# Patient Record
Sex: Female | Born: 1985 | Race: White | Hispanic: No | Marital: Married | Smoking: Never smoker
Health system: Southern US, Community
[De-identification: ages and names within clinical notes are randomized; demographics above are authoritative.]

## PROBLEM LIST (undated history)

## (undated) ENCOUNTER — Inpatient Hospital Stay (HOSPITAL_COMMUNITY): Payer: Self-pay

## (undated) DIAGNOSIS — Z789 Other specified health status: Secondary | ICD-10-CM

## (undated) DIAGNOSIS — N63 Unspecified lump in unspecified breast: Secondary | ICD-10-CM

## (undated) DIAGNOSIS — N200 Calculus of kidney: Secondary | ICD-10-CM

## (undated) HISTORY — PX: WISDOM TOOTH EXTRACTION: SHX21

## (undated) HISTORY — PX: NASAL SINUS SURGERY: SHX719

## (undated) HISTORY — PX: FRACTURE SURGERY: SHX138

## (undated) HISTORY — DX: Unspecified lump in unspecified breast: N63.0

## (undated) HISTORY — PX: DILATION AND CURETTAGE OF UTERUS: SHX78

---
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
## 2003-03-15 ENCOUNTER — Emergency Department (HOSPITAL_COMMUNITY): Admission: EM | Admit: 2003-03-15 | Discharge: 2003-03-15 | Payer: Self-pay | Admitting: Emergency Medicine

## 2003-10-01 ENCOUNTER — Encounter: Payer: Self-pay | Admitting: Emergency Medicine

## 2003-10-01 ENCOUNTER — Emergency Department (HOSPITAL_COMMUNITY): Admission: EM | Admit: 2003-10-01 | Discharge: 2003-10-01 | Payer: Self-pay | Admitting: Emergency Medicine

## 2004-04-08 ENCOUNTER — Emergency Department (HOSPITAL_COMMUNITY): Admission: EM | Admit: 2004-04-08 | Discharge: 2004-04-08 | Payer: Self-pay | Admitting: Emergency Medicine

## 2006-08-10 ENCOUNTER — Ambulatory Visit (HOSPITAL_COMMUNITY): Admission: RE | Admit: 2006-08-10 | Discharge: 2006-08-10 | Payer: Self-pay | Admitting: Otolaryngology

## 2007-04-07 ENCOUNTER — Emergency Department (HOSPITAL_COMMUNITY): Admission: EM | Admit: 2007-04-07 | Discharge: 2007-04-07 | Payer: Self-pay | Admitting: Emergency Medicine

## 2007-09-05 ENCOUNTER — Other Ambulatory Visit: Admission: RE | Admit: 2007-09-05 | Discharge: 2007-09-05 | Payer: Self-pay | Admitting: Obstetrics and Gynecology

## 2008-08-11 ENCOUNTER — Other Ambulatory Visit: Admission: RE | Admit: 2008-08-11 | Discharge: 2008-08-11 | Payer: Self-pay | Admitting: Obstetrics and Gynecology

## 2009-08-16 ENCOUNTER — Other Ambulatory Visit: Admission: RE | Admit: 2009-08-16 | Discharge: 2009-08-16 | Payer: Self-pay | Admitting: Obstetrics and Gynecology

## 2009-10-01 ENCOUNTER — Ambulatory Visit: Payer: Self-pay | Admitting: Vascular Surgery

## 2010-04-02 ENCOUNTER — Emergency Department (HOSPITAL_COMMUNITY): Admission: EM | Admit: 2010-04-02 | Discharge: 2010-04-02 | Payer: Self-pay | Admitting: Family Medicine

## 2010-04-27 ENCOUNTER — Ambulatory Visit (HOSPITAL_COMMUNITY): Admission: RE | Admit: 2010-04-27 | Discharge: 2010-04-27 | Payer: Self-pay | Admitting: Preventative Medicine

## 2010-08-30 ENCOUNTER — Other Ambulatory Visit: Admission: RE | Admit: 2010-08-30 | Discharge: 2010-08-30 | Payer: Self-pay | Admitting: Obstetrics and Gynecology

## 2010-09-18 ENCOUNTER — Emergency Department (HOSPITAL_COMMUNITY): Admission: EM | Admit: 2010-09-18 | Discharge: 2010-09-18 | Payer: Self-pay | Admitting: Emergency Medicine

## 2010-09-20 ENCOUNTER — Ambulatory Visit: Payer: Self-pay | Admitting: Cardiology

## 2010-09-20 ENCOUNTER — Encounter (INDEPENDENT_AMBULATORY_CARE_PROVIDER_SITE_OTHER): Payer: Self-pay | Admitting: Internal Medicine

## 2010-09-20 ENCOUNTER — Ambulatory Visit (HOSPITAL_COMMUNITY): Admission: RE | Admit: 2010-09-20 | Discharge: 2010-09-20 | Payer: Self-pay | Admitting: Internal Medicine

## 2010-12-24 ENCOUNTER — Inpatient Hospital Stay (HOSPITAL_COMMUNITY)
Admission: AD | Admit: 2010-12-24 | Discharge: 2010-12-25 | Payer: Self-pay | Source: Home / Self Care | Attending: Obstetrics and Gynecology | Admitting: Obstetrics and Gynecology

## 2011-01-02 LAB — HCG, QUANTITATIVE, PREGNANCY: hCG, Beta Chain, Quant, S: 11503 m[IU]/mL — ABNORMAL HIGH (ref <5)

## 2011-01-02 LAB — URINALYSIS, ROUTINE W REFLEX MICROSCOPIC
Bilirubin Urine: NEGATIVE
Ketones, ur: NEGATIVE mg/dL
Leukocytes, UA: NEGATIVE
Nitrite: NEGATIVE
Protein, ur: NEGATIVE mg/dL
Specific Gravity, Urine: 1.025 (ref 1.005–1.030)
Urine Glucose, Fasting: NEGATIVE mg/dL
Urobilinogen, UA: 0.2 mg/dL (ref 0.0–1.0)
pH: 6 (ref 5.0–8.0)

## 2011-01-02 LAB — URINE MICROSCOPIC-ADD ON

## 2011-01-02 LAB — CBC
HCT: 36.6 % (ref 36.0–46.0)
Hemoglobin: 12.1 g/dL (ref 12.0–15.0)
MCH: 28.9 pg (ref 26.0–34.0)
MCHC: 33.1 g/dL (ref 30.0–36.0)
MCV: 87.6 fL (ref 78.0–100.0)
Platelets: 240 10*3/uL (ref 150–400)
RBC: 4.18 MIL/uL (ref 3.87–5.11)
RDW: 12.5 % (ref 11.5–15.5)
WBC: 7.7 10*3/uL (ref 4.0–10.5)

## 2011-01-02 LAB — ABO/RH: ABO/RH(D): O POS

## 2011-01-02 LAB — WET PREP, GENITAL
Clue Cells Wet Prep HPF POC: NONE SEEN
Trich, Wet Prep: NONE SEEN
Yeast Wet Prep HPF POC: NONE SEEN

## 2011-01-02 LAB — GC/CHLAMYDIA PROBE AMP, GENITAL
Chlamydia, DNA Probe: NEGATIVE
GC Probe Amp, Genital: NEGATIVE

## 2011-01-02 LAB — POCT PREGNANCY, URINE: Preg Test, Ur: POSITIVE

## 2011-01-09 ENCOUNTER — Inpatient Hospital Stay (HOSPITAL_COMMUNITY)
Admission: AD | Admit: 2011-01-09 | Discharge: 2011-01-09 | Payer: Self-pay | Source: Home / Self Care | Attending: Obstetrics and Gynecology | Admitting: Obstetrics and Gynecology

## 2011-01-10 LAB — CBC
HCT: 31.8 % — ABNORMAL LOW (ref 36.0–46.0)
Hemoglobin: 10.7 g/dL — ABNORMAL LOW (ref 12.0–15.0)
MCH: 29.3 pg (ref 26.0–34.0)
MCHC: 33.6 g/dL (ref 30.0–36.0)
MCV: 87.1 fL (ref 78.0–100.0)
Platelets: 190 10*3/uL (ref 150–400)
RBC: 3.65 MIL/uL — ABNORMAL LOW (ref 3.87–5.11)
RDW: 12.7 % (ref 11.5–15.5)
WBC: 7 10*3/uL (ref 4.0–10.5)

## 2011-01-23 ENCOUNTER — Other Ambulatory Visit (HOSPITAL_COMMUNITY): Payer: Self-pay | Admitting: Obstetrics and Gynecology

## 2011-01-23 ENCOUNTER — Ambulatory Visit (HOSPITAL_COMMUNITY)
Admission: RE | Admit: 2011-01-23 | Discharge: 2011-01-23 | Disposition: A | Discharge status: Home / Self Care | Payer: BC Managed Care – PPO | Source: Ambulatory Visit | Attending: Obstetrics and Gynecology | Admitting: Obstetrics and Gynecology

## 2011-01-23 DIAGNOSIS — O021 Missed abortion: Secondary | ICD-10-CM | POA: Insufficient documentation

## 2011-01-23 LAB — CBC
HCT: 39.8 % (ref 36.0–46.0)
Hemoglobin: 13.3 g/dL (ref 12.0–15.0)
MCH: 29.5 pg (ref 26.0–34.0)
MCHC: 33.4 g/dL (ref 30.0–36.0)
MCV: 88.2 fL (ref 78.0–100.0)
Platelets: 212 10*3/uL (ref 150–400)
RBC: 4.51 MIL/uL (ref 3.87–5.11)
RDW: 12.8 % (ref 11.5–15.5)
WBC: 9 10*3/uL (ref 4.0–10.5)

## 2011-01-29 NOTE — Op Note (Signed)
  NAME:  Gina Weaver, Gina Weaver             ACCOUNT NO.:  [id number]  MEDICAL RECORD NO.:  [id number]           PATIENT TYPE:  O  LOCATION:  [facility]                          [facility]ACILITY:  [facility]  PHYSICIAN:  Zelphia Cairo, MD    DATE OF BIRTH:  Nov 11, 1986  DATE OF PROCEDURE:  01/23/2011 DATE OF DISCHARGE:                              OPERATIVE REPORT   PREOPERATIVE DIAGNOSES: 1. Missed abortion. 2. Vaginal bleeding in pregnancy.  PROCEDURES: 1. Cervical block. 2. Suction dilatation and evacuation.  SURGEON:  Zelphia Cairo, MD  ANESTHESIA:  MAC and local.  SPECIMEN:  Products of conception to Pathology.  BLOOD LOSS:  Minimal.  COMPLICATIONS:  None.  CONDITION:  Stable to recovery room.  PROCEDURE:  [name] was taken to the operating room after informed consent was obtained.  She was placed in the dorsal lithotomy position using Allen stirrups, prepped and draped in sterile fashion once MAC anesthesia was determined to be adequate.  A bivalve speculum was placed in the vagina and a single-tooth tenaculum placed on the anterior lip of the cervix, 1% Nesacaine was used to provide a cervical block.  The cervix was then serially dilated using Pratt dilator and an 8-French suction catheter was inserted into the uterine cavity and products of conception were removed.  A gentle curetting was then performed with an endometrial cavity to ensure uterine cry.  Once the uterine cry was felt throughout the uterine cavity, the suction catheter was reinserted to remove any clots and debris.  No further tissue was noted to the suction catheter.  Tenaculum and speculum were removed.  She was taken to the recovery room in stable condition.  Sponge, lap, needle and instrument counts were correct x2.    Zelphia Cairo, MD    GA/MEDQ  D:  01/23/2011  T:  01/24/2011  Job:  [id number]  Electronically Signed by Zelphia Cairo MD on 01/26/2011 01:28:58 PM

## 2011-02-12 ENCOUNTER — Inpatient Hospital Stay (HOSPITAL_COMMUNITY)
Admission: AD | Admit: 2011-02-12 | Discharge: 2011-02-13 | Disposition: A | Discharge status: Home / Self Care | Payer: BC Managed Care – PPO | Source: Ambulatory Visit | Attending: Obstetrics and Gynecology | Admitting: Obstetrics and Gynecology

## 2011-02-12 DIAGNOSIS — O034 Incomplete spontaneous abortion without complication: Secondary | ICD-10-CM | POA: Insufficient documentation

## 2011-02-13 ENCOUNTER — Inpatient Hospital Stay (HOSPITAL_COMMUNITY): Payer: BC Managed Care – PPO

## 2011-02-13 LAB — HCG, QUANTITATIVE, PREGNANCY: hCG, Beta Chain, Quant, S: 231 m[IU]/mL — ABNORMAL HIGH (ref <5)

## 2011-02-13 LAB — CBC
HCT: 32.7 % — ABNORMAL LOW (ref 36.0–46.0)
Hemoglobin: 10.8 g/dL — ABNORMAL LOW (ref 12.0–15.0)
MCH: 29.6 pg (ref 26.0–34.0)
MCHC: 33 g/dL (ref 30.0–36.0)
MCV: 89.6 fL (ref 78.0–100.0)
Platelets: 184 10*3/uL (ref 150–400)
RBC: 3.65 MIL/uL — ABNORMAL LOW (ref 3.87–5.11)
RDW: 13 % (ref 11.5–15.5)
WBC: 7.3 10*3/uL (ref 4.0–10.5)

## 2011-03-01 LAB — POCT I-STAT, CHEM 8
BUN: 11 mg/dL (ref 6–23)
Calcium, Ion: 1.18 mmol/L (ref 1.12–1.32)
Chloride: 106 mEq/L (ref 96–112)
Creatinine, Ser: 0.6 mg/dL (ref 0.4–1.2)
Glucose, Bld: 95 mg/dL (ref 70–99)
HCT: 47 % — ABNORMAL HIGH (ref 36.0–46.0)
Hemoglobin: 16 g/dL — ABNORMAL HIGH (ref 12.0–15.0)
Potassium: 3.7 mEq/L (ref 3.5–5.1)
Sodium: 142 mEq/L (ref 135–145)
TCO2: 26 mmol/L (ref 0–100)

## 2011-05-02 NOTE — Consult Note (Signed)
NEW PATIENT CONSULTATION   Gina Weaver, Gina Weaver  DOB:  [birth year]                                       10/01/2009  ZOXWR#:[id number]   The patient presents today for evaluation of left leg pain and swelling.  She is a very pleasant 25 year old white female who had a significant  injury with a fall as a 58-year-old.  She had multiple injuries including  a femur fracture which was treated at [facility].  She  is in traction and then a total body cast.  She had a much better  outcome than expected and eventually recovered normal walking and  actually ran cross country in high school.  She has continued to have a  heavy sensation of her left thigh with soreness with exercise.  She  specifically denies any swelling in her left leg and has no symptoms in  her right leg.  She had undergone a  venous refill study at an  apothecary store and was told that this was abnormal and may need  compression garments.  She is seeing [place] today for a further discussion.  According to her mother who is here with her today she did have a DVT at  the time of her injury when she was 4 in 35.   PAST MEDICAL HISTORY:  Otherwise completely normal for cardiac,  pulmonary or GI difficulty.   FAMILY HISTORY:  Is negative for premature atherosclerotic disease.   SOCIAL HISTORY:  She is married.  She does not smoke or drink alcohol.   REVIEW OF SYSTEMS:  Positive for asthma, pain in the leg with walking,  occasional dizziness, otherwise normal.   PHYSICAL EXAMINATION:  A well-developed, well-nourished, thin white  female in no acute stress.  Her blood pressure is 108/69, pulse 84,  respirations 18.  She is grossly intact neurologically.  Her dorsalis  pedis pulses are 2+ bilaterally.  Her lower extremities showed no  evidence of varicosities or spider vein telangiectasia and no swelling.   She underwent noninvasive vascular laboratory studies in our office  today and I  reviewed this with her and this shows no evidence of reflux  in her left saphenous vein, no evidence of DVT, no evidence of venous  occlusion and no evidence of deep or superficial reflux.  I discussed  this at length with the patient and her mother present.  I do not feel  that she would require any other workup.  I do not feel that she would  have any benefit from compression garments.  She is quite pleased with  this discussion.  I explained that this is probably going to be a  chronic discomfort in her thigh related to the trauma she suffered  nearly 20 years ago.  She will see [place] again on an as-needed basis.   Larina Earthly, M.D.  Electronically Signed   TFE/MEDQ  D:  10/01/2009  T:  10/01/2009  Job:  [id number]   cc:   Kingsley Callander. Ouida Sills, MD

## 2011-05-02 NOTE — Procedures (Signed)
DUPLEX DEEP VENOUS EXAM - LOWER EXTREMITY   INDICATION:  Check for left lower extremity venous insufficiency.   HISTORY:  Edema:  No.  Trauma/Surgery:  Broken left femur years ago.  Pain:  No.  PE:  No.  Previous DVT:  No.  Anticoagulants:  No.  Other:   DUPLEX EXAM:                CFV   SFV   PopV  PTV    GSV                R  L  R  L  R  L  R   L  R  L  Thrombosis    o  o     o     o      o     o  Spontaneous   +  +     +     +      +     +  Phasic        +  +     +     +      +     +  Augmentation  +  +     +     +      +     +  Compressible  +  +     +     +      +     +  Competent     +  +     +     +      +     +   Legend:  + - yes  o - no  p - partial  D - decreased   IMPRESSION:  No evidence of deep vein thrombosis or venous reflux noted  in the left lower extremity.    _____________________________  Larina Earthly, M.D.   CH/MEDQ  D:  10/01/2009  T:  10/02/2009  Job:  [id number]

## 2011-06-11 IMAGING — CT CT ANGIO CHEST
2 of 6 series · 6 of 36 positions shown · IV contrast (Omnipaque 300)
Comparison: None.

CLINICAL DATA: Shortness of breath with palpitations and
dizziness.  Question pulmonary embolism.

CT ANGIOGRAPHY CHEST WITH CONTRAST
TECHNIQUE: Multidetector CT imaging of the chest was performed
using the standard protocol during bolus administration of
intravenous contrast.  Multiplanar CT image reconstructions
including MIPs were obtained to evaluate the vascular anatomy.
Contrast:  100 ml Smnipaque-B33 intravenously.

[Series 4: pe 3.0 b40f · axial · 0.50mm/px · z∈[-278,-107]mm · 5 of 87 slices shown]
[im 15/87  lung]
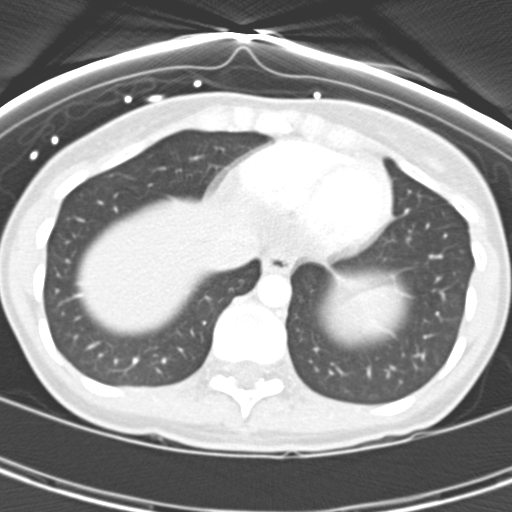
[im 29/87  mediastinal]
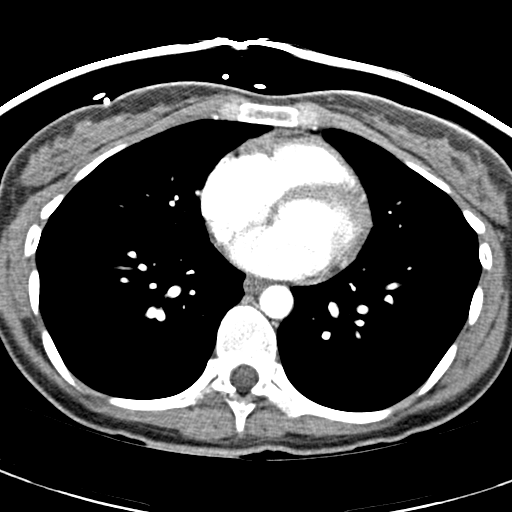
[im 44/87  lung]
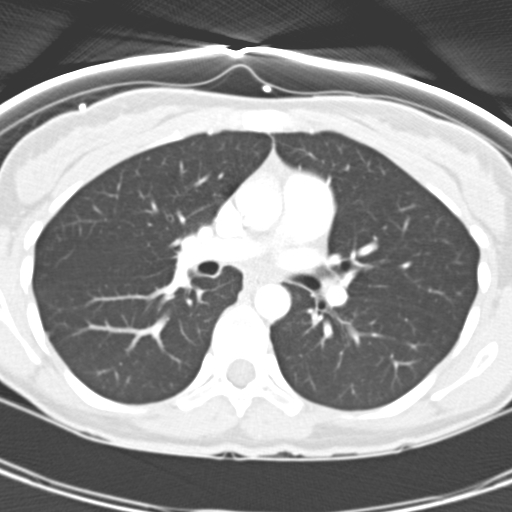
[im 58/87  mediastinal]
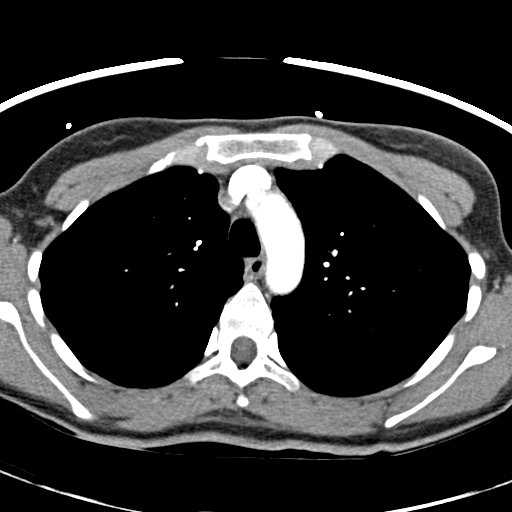
[im 72/87  lung]
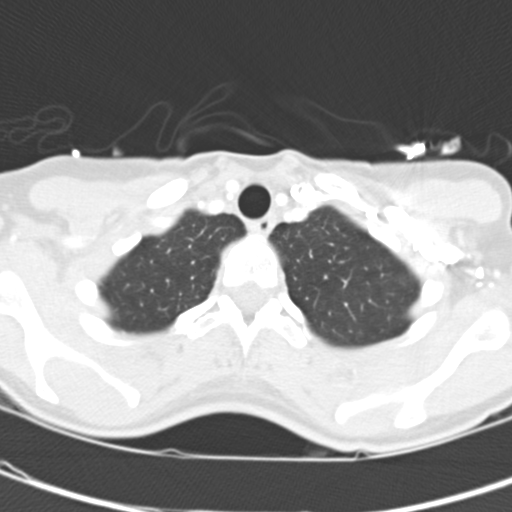

[Series 6: mpr coronal pe 3mm · coronal · 0.50mm/px · 1 of 54 slices shown]
[im 27/54  mediastinal]
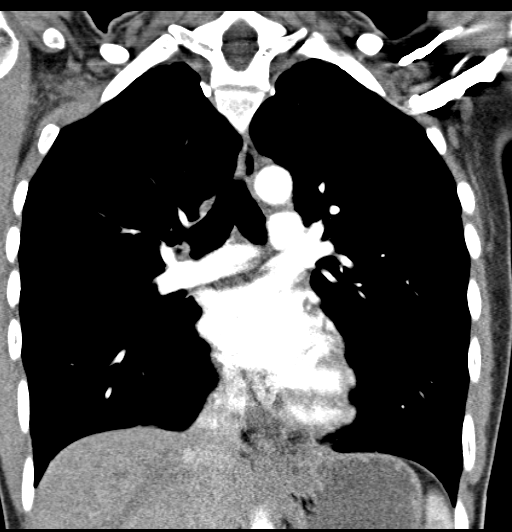

[6 of 36 positions shown; findings below may reference images not displayed]

FINDINGS: The pulmonary arteries are well opacified with contrast.
There is no evidence of acute pulmonary embolism.  The thoracic
aorta appears normal.

There are no enlarged mediastinal or hilar lymph nodes.  There is
no pleural or pericardial effusion.  The lungs are clear.  The
visualized upper abdomen appears unremarkable.

Review of the MIP images confirms the above findings.
IMPRESSION: Normal examination.  No evidence of pulmonary embolism or other
acute chest process.

## 2011-10-02 IMAGING — US US OB COMP LESS 14 WK
1 series · 14 of 25 positions shown · non-contrast
Comparison: 12/25/2010

CLINICAL DATA: Early pregnancy.  Vaginal bleeding.

OBSTETRIC <14 WK US AND TRANSVAGINAL OB US
TECHNIQUE: Both transabdominal and transvaginal ultrasound
examinations were performed for complete evaluation of the
gestation as well as the maternal uterus, adnexal regions, and
pelvic cul-de-sac.  Transvaginal technique was performed to assess
early pregnancy.

[Series 1: us ob comp less 14 wks · 25 acquisitions, 14 frames shown]
[im 1/25]
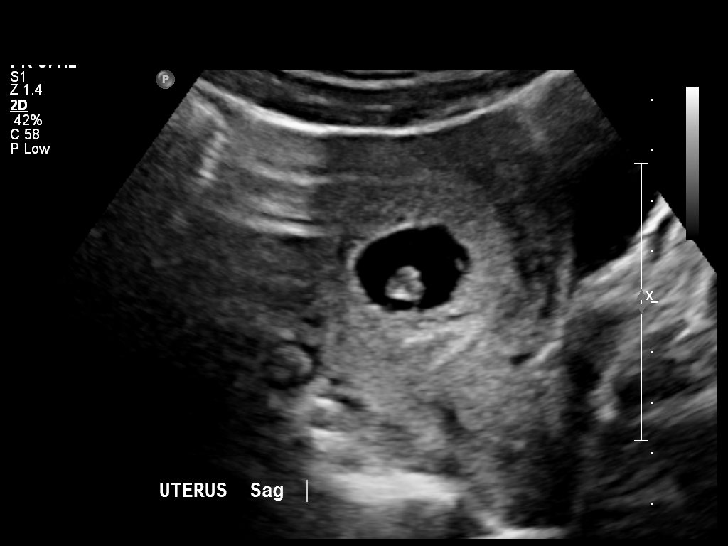
[im 3/25]
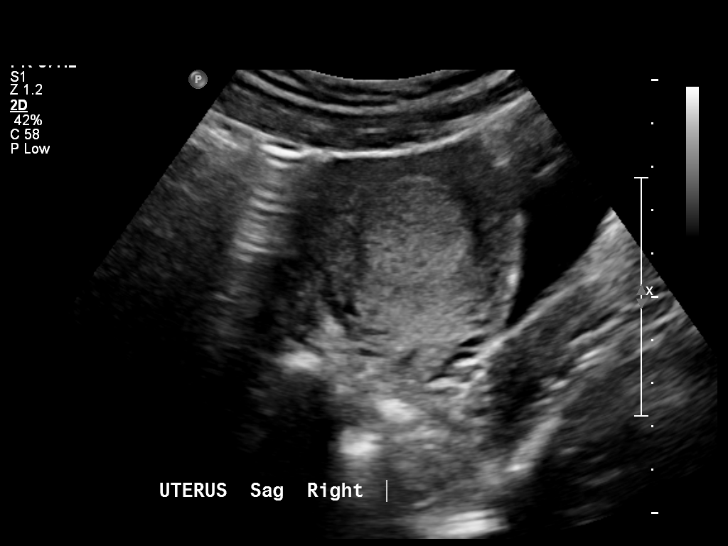
[im 5/25]
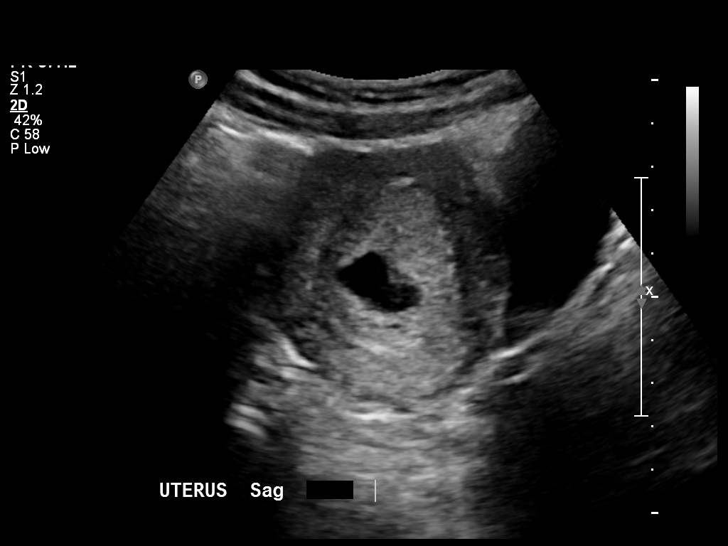
[im 7/25]
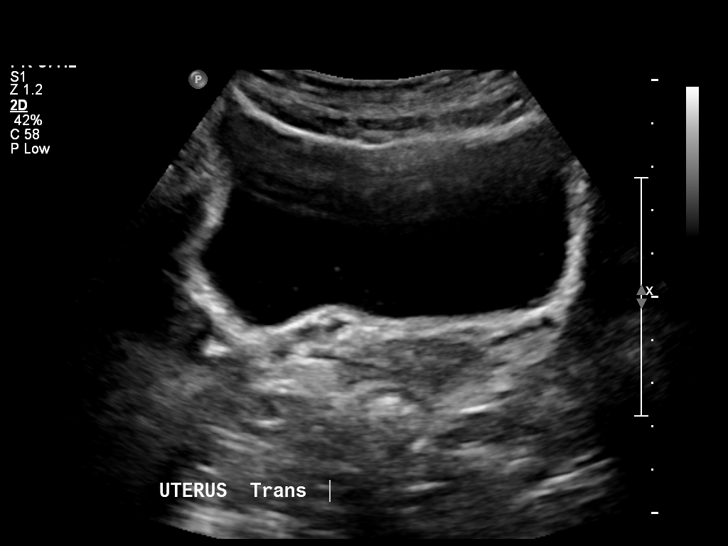
[im 9/25]
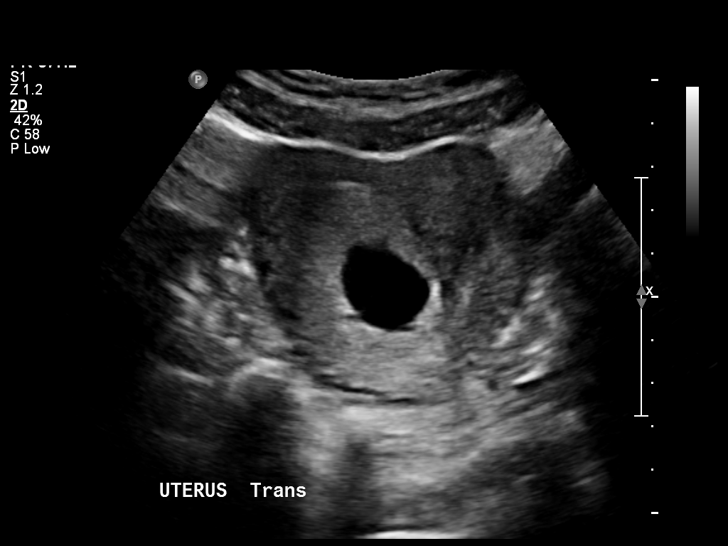
[im 10/25]
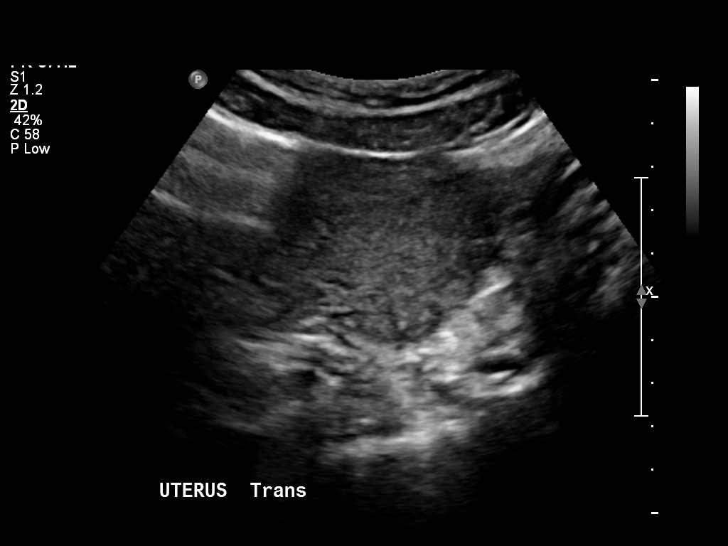
[im 12/25]
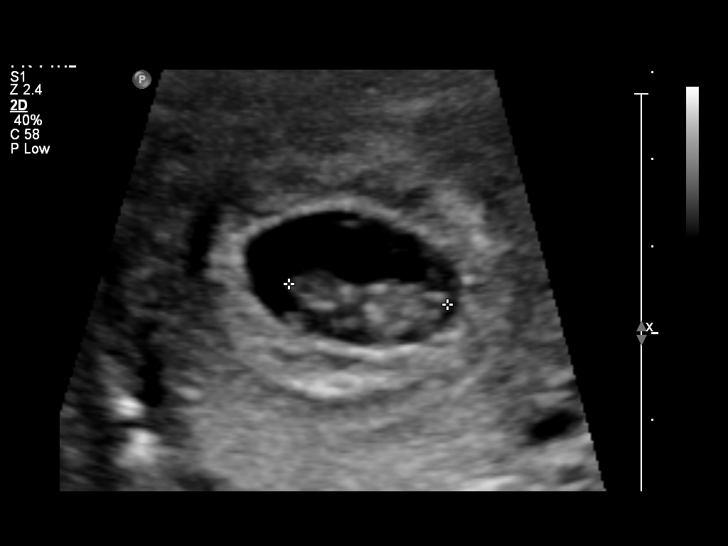
[im 14/25]
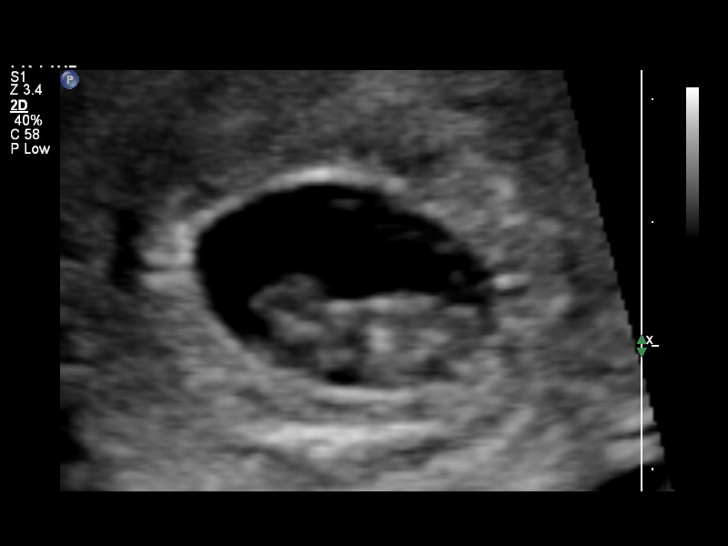
[im 16/25]
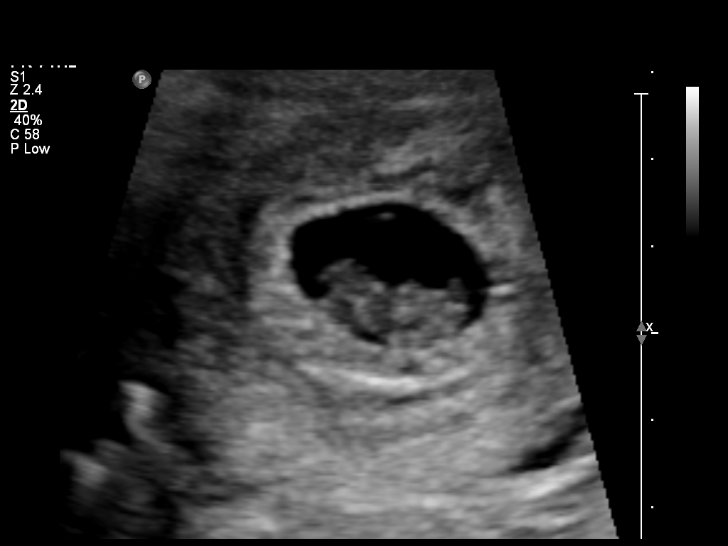
[im 17/25]
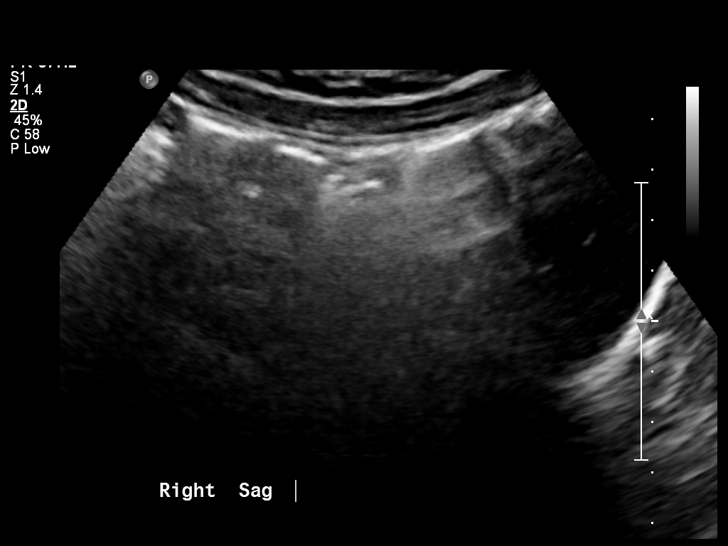
[im 19/25]
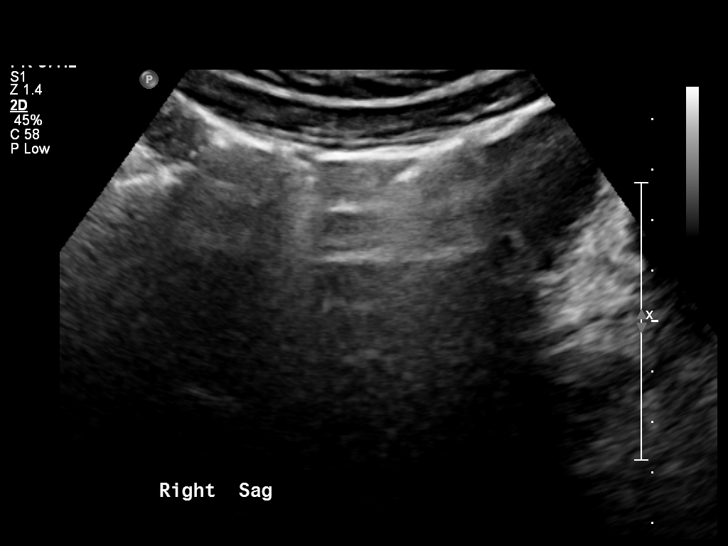
[im 21/25]
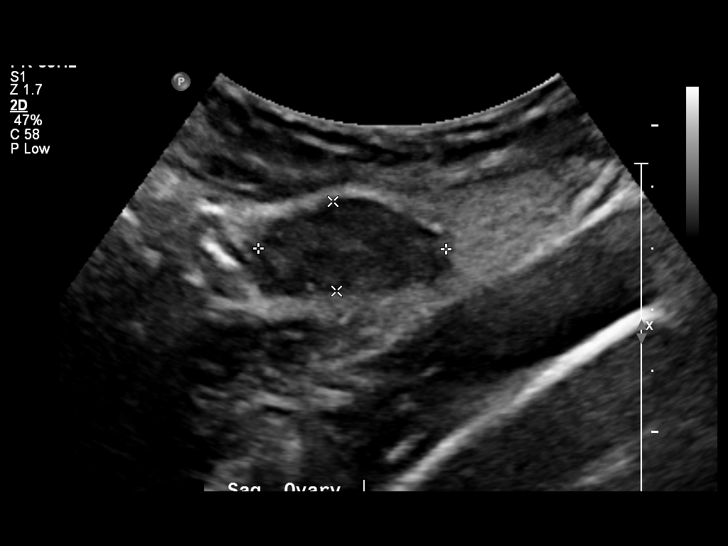
[im 23/25]
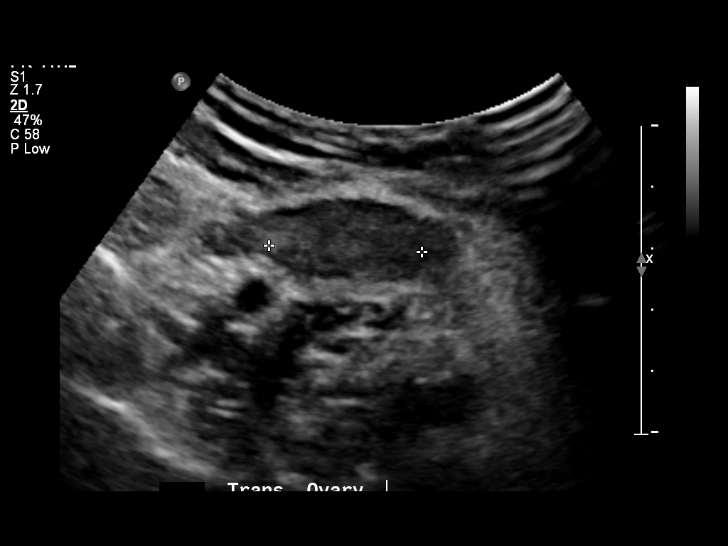
[im 25/25]
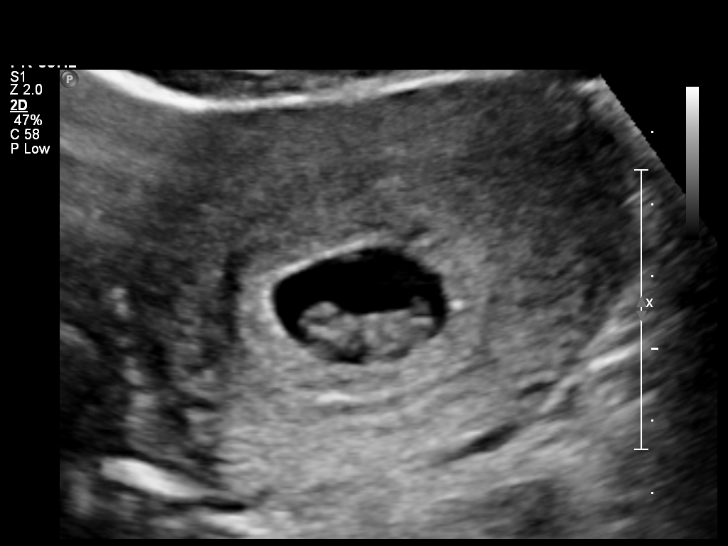

[14 of 25 positions shown; findings below may reference images not displayed]

Intrauterine gestational sac:  Single normal appearing gestational
sac.
Yolk sac: Present
Embryo: Present
Cardiac Activity: Present
Heart Rate: 172 bpm

CRL: 18   mm  8   w  3   d          US EDC: 08/18/2011

Maternal uterus/adnexae:
No subchorionic hemorrhage.  Myometrial tissue appears normal.
Right ovary not seen.  Left ovary measures 3.0 x 1.5 x 2.5 cm.  No
free fluid.

Appropriate interval growth since the previous study.
IMPRESSION: Normal appearing examination.  Single living intrauterine gestation
at 8 weeks 3 days.

## 2011-10-20 ENCOUNTER — Other Ambulatory Visit (HOSPITAL_COMMUNITY)
Admission: RE | Admit: 2011-10-20 | Discharge: 2011-10-20 | Disposition: A | Discharge status: Home / Self Care | Payer: BC Managed Care – PPO | Source: Ambulatory Visit | Attending: Obstetrics and Gynecology | Admitting: Obstetrics and Gynecology

## 2011-10-20 ENCOUNTER — Other Ambulatory Visit: Payer: Self-pay | Admitting: Adult Health

## 2011-10-20 DIAGNOSIS — Z01419 Encounter for gynecological examination (general) (routine) without abnormal findings: Secondary | ICD-10-CM | POA: Insufficient documentation

## 2011-12-14 ENCOUNTER — Encounter: Payer: Self-pay | Admitting: Emergency Medicine

## 2011-12-14 ENCOUNTER — Emergency Department (HOSPITAL_COMMUNITY): Payer: No Typology Code available for payment source

## 2011-12-14 ENCOUNTER — Other Ambulatory Visit: Payer: Self-pay

## 2011-12-14 ENCOUNTER — Emergency Department (HOSPITAL_COMMUNITY)
Admission: EM | Admit: 2011-12-14 | Discharge: 2011-12-14 | Disposition: A | Discharge status: Home / Self Care | Payer: No Typology Code available for payment source | Attending: Emergency Medicine | Admitting: Emergency Medicine

## 2011-12-14 DIAGNOSIS — R0602 Shortness of breath: Secondary | ICD-10-CM | POA: Insufficient documentation

## 2011-12-14 DIAGNOSIS — E876 Hypokalemia: Secondary | ICD-10-CM | POA: Insufficient documentation

## 2011-12-14 DIAGNOSIS — J45909 Unspecified asthma, uncomplicated: Secondary | ICD-10-CM | POA: Insufficient documentation

## 2011-12-14 DIAGNOSIS — R209 Unspecified disturbances of skin sensation: Secondary | ICD-10-CM | POA: Insufficient documentation

## 2011-12-14 DIAGNOSIS — R42 Dizziness and giddiness: Secondary | ICD-10-CM | POA: Insufficient documentation

## 2011-12-14 LAB — CBC
HCT: 37.5 % (ref 36.0–46.0)
Hemoglobin: 12.4 g/dL (ref 12.0–15.0)
MCH: 29.4 pg (ref 26.0–34.0)
MCV: 88.9 fL (ref 78.0–100.0)
Platelets: 224 10*3/uL (ref 150–400)
RBC: 4.22 MIL/uL (ref 3.87–5.11)
WBC: 6.6 10*3/uL (ref 4.0–10.5)

## 2011-12-14 LAB — BASIC METABOLIC PANEL
BUN: 12 mg/dL (ref 6–23)
CO2: 25 mEq/L (ref 19–32)
Calcium: 9.9 mg/dL (ref 8.4–10.5)
Chloride: 104 mEq/L (ref 96–112)
Creatinine, Ser: 0.63 mg/dL (ref 0.50–1.10)
Glucose, Bld: 120 mg/dL — ABNORMAL HIGH (ref 70–99)

## 2011-12-14 LAB — PREGNANCY, URINE: Preg Test, Ur: NEGATIVE

## 2011-12-14 MED ORDER — SODIUM CHLORIDE 0.9 % IV BOLUS (SEPSIS)
1000.0000 mL | Freq: Once | INTRAVENOUS | Status: AC
  Administered 2011-12-14: 1000 mL via INTRAVENOUS

## 2011-12-14 MED ORDER — DIAZEPAM 5 MG PO TABS
5.0000 mg | ORAL_TABLET | Freq: Once | ORAL | Status: AC
  Administered 2011-12-14: 5 mg via ORAL
  Filled 2011-12-14: qty 1

## 2011-12-14 MED ORDER — POTASSIUM CHLORIDE 20 MEQ/15ML (10%) PO LIQD
40.0000 meq | Freq: Once | ORAL | Status: AC
  Administered 2011-12-14: 40 meq via ORAL
  Filled 2011-12-14: qty 30

## 2011-12-14 MED ORDER — MECLIZINE HCL 25 MG PO TABS: 25.0000 mg | ORAL_TABLET | Freq: Three times a day (TID) | ORAL | Status: AC | PRN

## 2011-12-14 NOTE — ED Notes (Signed)
Pt called ambulance for shortness of breath, took two puffs from inhaler with some relief. On ems arrival patient complained of breathing difficulty when standing up/walking.

## 2011-12-14 NOTE — ED Provider Notes (Signed)
History     CSN: [id number]  Arrival date & time 12/14/11  0035   First MD Initiated Contact with Patient 12/14/11 0044      Chief Complaint  Patient presents with  . Shortness of Breath     The history is provided by the patient.   patient reports 2 weeks of dizziness which is best described as occasional lightheadedness with associated disequilibrium.  She denies classic vertiginous symptoms of room spinning round and round.  She denies unilateral arm or leg weakness.  She reports that this evening she developed the dizziness again which then made her feel short of breath and pressure in her chest and gave her a sense of numbness and tingling in her bilateral arms.  She has a history of asthma but reports her breathing was different than that.  She reports her breathing is returned to baseline and she no longer has numbness or tingling.  She still reports "a funny feeling around my head".  She reports "I just don't eel right".  She denies headache or vision changes.  She reports no recent trauma or falls.  She's currently on her menstrual cycle.  She denies abdominal pain.  She has no chest pain at this time.  She's never been evaluated for the symptoms.  She does have a prior history of nasal surgery due to severe sinus disease.  She denies recent upper respiratory symptoms.  Nothing seems to exacerbate her symptoms.  Nothing improves her symptoms.  Her symptoms are intermittent and in transient and using resolve on the round.  Tonight the numbness and tingling and difficulty swallowing or breathing was new which has since resolved.  She presented via EMS was given a breathing treatment in route her reports this did improve her breathing somewhat  Past Medical History  Diagnosis Date  . Asthma     Past Surgical History  Procedure Date  . Nasal sinus surgery     Family History  Problem Relation Age of Onset  . Diabetes Mother     History  Substance Use Topics  . Smoking status:  Never Smoker   . Smokeless tobacco: Not on file  . Alcohol Use: No    OB History    Grav Para Term Preterm Abortions TAB SAB Ect Mult Living   1    1           Review of Systems  All other systems reviewed and are negative.    Allergies  Penicillins  Home Medications   Current Outpatient Rx  Name Route Sig Dispense Refill  . ALBUTEROL SULFATE (5 MG/ML) 0.5% IN NEBU Nebulization Take 2.5 mg by nebulization every 6 (six) hours as needed.      . MULTIVITAMINS PO CAPS Oral Take 1 capsule by mouth daily.      Marland Kitchen MECLIZINE HCL 25 MG PO TABS Oral Take 1 tablet (25 mg total) by mouth 3 (three) times daily as needed. 15 tablet 0    BP 105/65  Pulse 76  Temp(Src) 97.6 F (36.4 C) (Oral)  Resp 18  Ht 5\' 4"  (1.626 m)  Wt 109 lb (49.442 kg)  BMI 18.71 kg/m2  SpO2 98%  Physical Exam  Nursing note and vitals reviewed. Constitutional: She is oriented to person, place, and time. She appears well-developed and well-nourished.  HENT:  Head: Normocephalic and atraumatic.  Eyes: Pupils are equal, round, and reactive to light.  Neck: Normal range of motion.  Cardiovascular: Normal rate, regular rhythm and normal  heart sounds.   No murmur heard. Pulmonary/Chest: Effort normal. No respiratory distress. She has no wheezes. She has no rales.  Abdominal: Soft.  Musculoskeletal: Normal range of motion.  Neurological: She is alert and oriented to person, place, and time.       5/5 strength in major muscle groups of  bilateral upper and lower extremities. Speech normal. No facial asymetry.   Skin: Skin is warm and dry.  Psychiatric: Judgment normal.       Anxious appearing    ED Course  Procedures (including critical care time)   Date: 12/14/2011  Rate: 73  Rhythm: normal sinus rhythm  QRS Axis: normal  Intervals: normal  ST/T Wave abnormalities: normal  Conduction Disutrbances:none  Narrative Interpretation:   Old EKG Reviewed: No significant changes noted     Labs Reviewed   BASIC METABOLIC PANEL - Abnormal; Notable for the following:    Potassium 3.0 (*)    Glucose, Bld 120 (*)    All other components within normal limits  CBC  PREGNANCY, URINE   Dg Chest 2 View  12/14/2011  *RADIOLOGY REPORT*  Clinical Data: Shortness of breath and palpitations; left hand and finger numbness.  History of asthma.  CHEST - 2 VIEW  Comparison: CTA of the chest performed 09/18/2010  Findings: The lungs are well-aerated and clear.  There is no evidence of focal opacification, pleural effusion or pneumothorax.  The heart is normal in size; the mediastinal contour is within normal limits.  No acute osseous abnormalities are seen.  IMPRESSION: No acute cardiopulmonary process seen.  Original Report Authenticated By: Tonia Ghent, M.D.     1. Vertigo   2. Hypokalemia    MDM  The patient feels much better after Valium.  I suspect there is a vertiginous component to this.  We discharged home with meclizine and followup with a neurologist.  She has a normal neurologic exam at this time.  Her pulmonary and cardiovascular exams are normal.  I doubt this is an asthma exacerbation.  She had mild hypokalemia that was treated with oral potassium in the emergency department.  EKG was normal sinus rhythm.         Lyanne Co, MD 12/14/11 [phone]

## 2012-04-03 ENCOUNTER — Other Ambulatory Visit: Payer: Self-pay | Admitting: Otolaryngology

## 2012-04-03 ENCOUNTER — Ambulatory Visit
Admission: RE | Admit: 2012-04-03 | Discharge: 2012-04-03 | Disposition: A | Discharge status: Home / Self Care | Payer: No Typology Code available for payment source | Source: Ambulatory Visit | Attending: Otolaryngology | Admitting: Otolaryngology

## 2012-08-29 LAB — OB RESULTS CONSOLE RPR: RPR: NONREACTIVE

## 2012-08-29 LAB — OB RESULTS CONSOLE HIV ANTIBODY (ROUTINE TESTING): HIV: NONREACTIVE

## 2012-08-29 LAB — OB RESULTS CONSOLE RUBELLA ANTIBODY, IGM: Rubella: IMMUNE

## 2012-08-29 LAB — OB RESULTS CONSOLE HEPATITIS B SURFACE ANTIGEN: Hepatitis B Surface Ag: NEGATIVE

## 2012-08-29 LAB — OB RESULTS CONSOLE ANTIBODY SCREEN: Antibody Screen: NEGATIVE

## 2012-10-31 ENCOUNTER — Inpatient Hospital Stay (HOSPITAL_COMMUNITY)
Admission: AD | Admit: 2012-10-31 | Discharge: 2012-10-31 | Disposition: A | Discharge status: Home / Self Care | Payer: No Typology Code available for payment source | Source: Ambulatory Visit | Attending: Obstetrics and Gynecology | Admitting: Obstetrics and Gynecology

## 2012-10-31 ENCOUNTER — Encounter (HOSPITAL_COMMUNITY): Payer: Self-pay | Admitting: *Deleted

## 2012-10-31 ENCOUNTER — Other Ambulatory Visit: Payer: Self-pay

## 2012-10-31 DIAGNOSIS — R002 Palpitations: Secondary | ICD-10-CM | POA: Insufficient documentation

## 2012-10-31 DIAGNOSIS — O99891 Other specified diseases and conditions complicating pregnancy: Secondary | ICD-10-CM | POA: Insufficient documentation

## 2012-10-31 HISTORY — DX: Other specified health status: Z78.9

## 2012-10-31 LAB — URINE MICROSCOPIC-ADD ON

## 2012-10-31 LAB — URINALYSIS, ROUTINE W REFLEX MICROSCOPIC
Bilirubin Urine: NEGATIVE
Glucose, UA: NEGATIVE mg/dL
Nitrite: NEGATIVE
Specific Gravity, Urine: 1.025 (ref 1.005–1.030)
pH: 6.5 (ref 5.0–8.0)

## 2012-10-31 NOTE — MAU Note (Signed)
About ago, heart was racing, became real dizzy- things started going dark/vision was blurring. Passed out.  When came to, heart was still racing. Called office was told to come in. Denies cardiac hx.

## 2012-10-31 NOTE — Progress Notes (Signed)
Pt passed out in car while waiting for food

## 2012-10-31 NOTE — MAU Provider Note (Signed)
  History     CSN: [id number]  Arrival date and time: 10/31/12 1456   First Provider Initiated Contact with Patient 10/31/12 1550      Chief Complaint  Patient presents with  . Loss of Consciousness   HPI Gina Weaver is 26 y.o. G2P0010 [redacted]w[redacted]d weeks presenting with heart palpitations and silvery floaters in her vision.  Today when she awakened, she had trouble focusing.  Went to work, at lunch began with racing heart and then felt her "go out" and hit the steering wheel- was parked.  Doesn't feels she loss consciousness.  Eating regularly. Called office and instructed her to come here.  Denies injury to abdomen.  Hit forehead on the steering wheel.  Has felt heart racing while in MAU.  Denies cardiac history.  Uncomplicated pregnancy.      Past Medical History  Diagnosis Date  . Asthma   . No pertinent past medical history     Past Surgical History  Procedure Date  . Nasal sinus surgery     Family History  Problem Relation Age of Onset  . Diabetes Mother     History  Substance Use Topics  . Smoking status: Never Smoker   . Smokeless tobacco: Not on file  . Alcohol Use: No    Allergies:  Allergies  Allergen Reactions  . Penicillins Rash    Prescriptions prior to admission  Medication Sig Dispense Refill  . albuterol (PROVENTIL HFA;VENTOLIN HFA) 108 (90 BASE) MCG/ACT inhaler Inhale 2 puffs into the lungs every 6 (six) hours as needed. For shortness of breath and wheezing      . Prenatal Vit-Fe Fumarate-FA (PRENATAL MULTIVITAMIN) TABS Take 1 tablet by mouth at bedtime.        Review of Systems  Constitutional: Negative.   HENT: Negative.   Eyes: Positive for blurred vision.  Respiratory: Negative.   Cardiovascular: Positive for palpitations. Negative for chest pain.  Gastrointestinal: Negative.   Genitourinary: Negative.   Musculoskeletal: Negative.    Physical Exam   Blood pressure 116/66, pulse 81, temperature 98.2 F (36.8 C), temperature source  Oral, resp. rate 18, height 5\' 4"  (1.626 m), weight 108 lb (48.988 kg), last menstrual period 03/08/2012, SpO2 100.00%.  Physical Exam  Constitutional: She is oriented to person, place, and time. She appears well-developed and well-nourished. No distress.  HENT:  Head: Normocephalic.  Neck: Normal range of motion.  Cardiovascular: Normal rate and regular rhythm.   Respiratory: Effort normal.  Neurological: She is alert and oriented to person, place, and time.  Skin: Skin is warm and dry.  Psychiatric: She has a normal mood and affect. Her behavior is normal.    EKG:  Normal sinus rhythm with rate of 81 MAU Course  Procedures  MDM 16:12 Reported to [facility] to Dr. Rana Snare.  Order given for EKG.  If normal, may discharge home.  Instruct patient to call for recurrent palpitations for them to refer to cardiologist.  Stress hydration.  Assessment and Plan  A:  Near syncopal episode at [redacted]w[redacted]d     Heart palpitations    Normal EKG  P:  Patient instructed to call office if palpitations recur.  Per Dr. Rana Snare, they will refer to Cardiologist      Stressed importance of staying well hydrated       KEY,EVE M 10/31/2012, 3:53 PM

## 2012-10-31 NOTE — MAU Note (Signed)
Pt passed out while waiting for lunch pt was sitting down

## 2012-10-31 NOTE — Progress Notes (Signed)
Pt states she can't seem to focus

## 2012-11-02 LAB — URINE CULTURE

## 2013-03-07 ENCOUNTER — Encounter (HOSPITAL_COMMUNITY): Payer: Self-pay | Admitting: *Deleted

## 2013-03-07 ENCOUNTER — Inpatient Hospital Stay (HOSPITAL_COMMUNITY)
Admission: AD | Admit: 2013-03-07 | Discharge: 2013-03-07 | Disposition: A | Discharge status: Home / Self Care | Payer: BC Managed Care – PPO | Source: Ambulatory Visit | Attending: Obstetrics and Gynecology | Admitting: Obstetrics and Gynecology

## 2013-03-07 DIAGNOSIS — R51 Headache: Secondary | ICD-10-CM | POA: Insufficient documentation

## 2013-03-07 DIAGNOSIS — IMO0002 Reserved for concepts with insufficient information to code with codable children: Secondary | ICD-10-CM | POA: Insufficient documentation

## 2013-03-07 LAB — COMPREHENSIVE METABOLIC PANEL
ALT: 12 U/L (ref 0–35)
Alkaline Phosphatase: 174 U/L — ABNORMAL HIGH (ref 39–117)
CO2: 24 mEq/L (ref 19–32)
GFR calc Af Amer: >90 mL/min (ref >90)
GFR calc non Af Amer: >90 mL/min (ref >90)
Glucose, Bld: 79 mg/dL (ref 70–99)
Potassium: 3.1 mEq/L — ABNORMAL LOW (ref 3.5–5.1)
Sodium: 137 mEq/L (ref 135–145)

## 2013-03-07 LAB — URINALYSIS, ROUTINE W REFLEX MICROSCOPIC
Bilirubin Urine: NEGATIVE
Glucose, UA: NEGATIVE mg/dL
Hgb urine dipstick: NEGATIVE
Ketones, ur: NEGATIVE mg/dL
Protein, ur: NEGATIVE mg/dL

## 2013-03-07 LAB — URINE MICROSCOPIC-ADD ON

## 2013-03-07 LAB — CBC
HCT: 35.1 % — ABNORMAL LOW (ref 36.0–46.0)
Hemoglobin: 11.6 g/dL — ABNORMAL LOW (ref 12.0–15.0)
MCH: 30.2 pg (ref 26.0–34.0)
RBC: 3.84 MIL/uL — ABNORMAL LOW (ref 3.87–5.11)

## 2013-03-07 NOTE — MAU Note (Signed)
Pt. Is 35.[redacted] weeks pregnant and is Here due to an increase in swelling in her feet and legs. Also complaining of floaters on and off. Also, left sided headache.

## 2013-03-07 NOTE — Treatment Plan (Signed)
Dr. Henderson Cloud notified of pt. Labs and bp, fetal heart tracing and contraction pattern. Orders for discharge home.

## 2013-03-07 NOTE — MAU Note (Signed)
Patient states she has had increasing swelling for the past 2 weeks. Has been seeing floaters. Patient denies pain, bleeding or leaking and reports good fetal movement.

## 2013-03-07 NOTE — Progress Notes (Signed)
27 yo G3P0 at 35 3/7 weeks C/O bilat swelling in feet and some "floaters" in vision. No blurry vision or severe HA or epigastric pain.  No vaginal bleeding or ROM.  Blood pressure 123/77, pulse 79, temperature 98.4 F (36.9 C), temperature source Oral, resp. rate 18, height 5' 4.5" (1.638 m), weight 137 lb 12.8 oz (62.506 kg), last menstrual period 03/08/2012, SpO2 100.00%.  Results for orders placed during the hospital encounter of 03/07/13 (from the past 24 hour(s))  CBC     Status: Abnormal   Collection Time    03/07/13  7:06 PM      Result Value Range   WBC 10.4  4.0 - 10.5 K/uL   RBC 3.84 (*) 3.87 - 5.11 MIL/uL   Hemoglobin 11.6 (*) 12.0 - 15.0 g/dL   HCT 16.1 (*) 09.6 - 04.5 %   MCV 91.4  78.0 - 100.0 fL   MCH 30.2  26.0 - 34.0 pg   MCHC 33.0  30.0 - 36.0 g/dL   RDW 40.9  81.1 - 91.4 %   Platelets 209  150 - 400 K/uL   CMET pending  Abd gravid without epigastric tenderness Uterus soft and NT LE bilat +2 edema DTR 2+ without clonus FHT reactive UC mild and irregular  A: dependent edema of pregnancy  P: Check CMET     If normal for pregnancy, will D/C home     [facility]     D/W patient and husband symptoms of preeclampsia     FU office as scheduled

## 2013-03-09 LAB — URINE CULTURE

## 2013-03-13 ENCOUNTER — Encounter (HOSPITAL_COMMUNITY): Payer: Self-pay | Admitting: *Deleted

## 2013-03-13 ENCOUNTER — Inpatient Hospital Stay (HOSPITAL_COMMUNITY)
Admission: AD | Admit: 2013-03-13 | Discharge: 2013-03-17 | DRG: 371 | Disposition: A | Discharge status: Home / Self Care | Payer: BC Managed Care – PPO | Source: Ambulatory Visit | Attending: Obstetrics and Gynecology | Admitting: Obstetrics and Gynecology

## 2013-03-13 DIAGNOSIS — O321XX0 Maternal care for breech presentation, not applicable or unspecified: Principal | ICD-10-CM | POA: Diagnosis present

## 2013-03-13 DIAGNOSIS — O4100X0 Oligohydramnios, unspecified trimester, not applicable or unspecified: Secondary | ICD-10-CM | POA: Diagnosis present

## 2013-03-13 HISTORY — DX: Calculus of kidney: N20.0

## 2013-03-13 MED ORDER — PRENATAL MULTIVITAMIN CH: 1.0000 | ORAL_TABLET | Freq: Every day | ORAL | Status: DC

## 2013-03-13 MED ORDER — ACETAMINOPHEN 325 MG PO TABS: 650.0000 mg | ORAL_TABLET | ORAL | Status: DC | PRN

## 2013-03-13 MED ORDER — ZOLPIDEM TARTRATE 5 MG PO TABS: 5.0000 mg | ORAL_TABLET | Freq: Every evening | ORAL | Status: DC | PRN

## 2013-03-13 MED ORDER — DEXTROSE IN LACTATED RINGERS 5 % IV SOLN
INTRAVENOUS | Status: DC
  Administered 2013-03-13: 125 mL/h via INTRAVENOUS

## 2013-03-13 MED ORDER — DOCUSATE SODIUM 100 MG PO CAPS: 100.0000 mg | ORAL_CAPSULE | Freq: Every day | ORAL | Status: DC

## 2013-03-13 MED ORDER — CALCIUM CARBONATE ANTACID 500 MG PO CHEW: 2.0000 | CHEWABLE_TABLET | ORAL | Status: DC | PRN

## 2013-03-13 NOTE — H&P (Signed)
27 yo G3P0 @ 36+2 wks presents w/ oligohydramnios.  Pt denies LOF, VB and ctx.  Pregnancy o/w uncomplicated.  PMHx:  See hollister  Af, VSS + FHT Gen - NAD Abd - gravid, NT Ext 2+ edema bilaterally PV - cvx closed, neg pool, negative nitrozine  [place]:  Breech, normal growth.  AFV 4cm (<3%),  Dopplers wnl  A/P:  Oligohydramnios, breech w/out evidence of rupture Admit for obs IVF and rpt [place] in am

## 2013-03-14 ENCOUNTER — Inpatient Hospital Stay (HOSPITAL_COMMUNITY): Payer: BC Managed Care – PPO

## 2013-03-14 ENCOUNTER — Encounter (HOSPITAL_COMMUNITY): Payer: Self-pay | Admitting: Anesthesiology

## 2013-03-14 ENCOUNTER — Encounter (HOSPITAL_COMMUNITY)
Admission: AD | Disposition: A | Discharge status: Home / Self Care | Payer: Self-pay | Source: Ambulatory Visit | Attending: Obstetrics and Gynecology

## 2013-03-14 ENCOUNTER — Inpatient Hospital Stay (HOSPITAL_COMMUNITY): Payer: BC Managed Care – PPO | Admitting: Anesthesiology

## 2013-03-14 LAB — CBC
MCHC: 33.9 g/dL (ref 30.0–36.0)
Platelets: 174 10*3/uL (ref 150–400)
RDW: 13.8 % (ref 11.5–15.5)
WBC: 9 10*3/uL (ref 4.0–10.5)

## 2013-03-14 SURGERY — Surgical Case
Anesthesia: Spinal | Site: Abdomen | Wound class: Clean Contaminated

## 2013-03-14 MED ORDER — MORPHINE SULFATE (PF) 0.5 MG/ML IJ SOLN
INTRAMUSCULAR | Status: DC | PRN
  Administered 2013-03-14: .1 mg via INTRATHECAL

## 2013-03-14 MED ORDER — IBUPROFEN 800 MG PO TABS
800.0000 mg | ORAL_TABLET | Freq: Three times a day (TID) | ORAL | Status: DC | PRN
  Administered 2013-03-15 – 2013-03-17 (×7): 800 mg via ORAL
  Filled 2013-03-14 (×7): qty 1

## 2013-03-14 MED ORDER — MENTHOL 3 MG MT LOZG: 1.0000 | LOZENGE | OROMUCOSAL | Status: DC | PRN

## 2013-03-14 MED ORDER — OXYCODONE-ACETAMINOPHEN 5-325 MG PO TABS
1.0000 | ORAL_TABLET | Freq: Four times a day (QID) | ORAL | Status: DC | PRN
  Administered 2013-03-15 – 2013-03-17 (×5): 1 via ORAL
  Filled 2013-03-14 (×5): qty 1

## 2013-03-14 MED ORDER — BISACODYL 10 MG RE SUPP: 10.0000 mg | Freq: Every day | RECTAL | Status: DC | PRN

## 2013-03-14 MED ORDER — OXYTOCIN 10 UNIT/ML IJ SOLN
40.0000 [IU] | INTRAVENOUS | Status: DC | PRN
  Administered 2013-03-14: 40 [IU] via INTRAVENOUS

## 2013-03-14 MED ORDER — MORPHINE SULFATE 0.5 MG/ML IJ SOLN
INTRAMUSCULAR | Status: AC
  Filled 2013-03-14: qty 10

## 2013-03-14 MED ORDER — ZOLPIDEM TARTRATE 5 MG PO TABS: 5.0000 mg | ORAL_TABLET | Freq: Every evening | ORAL | Status: DC | PRN

## 2013-03-14 MED ORDER — DIPHENHYDRAMINE HCL 50 MG/ML IJ SOLN: 25.0000 mg | INTRAMUSCULAR | Status: DC | PRN

## 2013-03-14 MED ORDER — SODIUM CHLORIDE 0.9 % IV SOLN
250.0000 mL | INTRAVENOUS | Status: DC
  Administered 2013-03-14: 250 mL via INTRAVENOUS

## 2013-03-14 MED ORDER — SCOPOLAMINE 1 MG/3DAYS TD PT72
MEDICATED_PATCH | TRANSDERMAL | Status: AC
  Administered 2013-03-14: 1.5 mg
  Filled 2013-03-14: qty 1

## 2013-03-14 MED ORDER — DIPHENHYDRAMINE HCL 25 MG PO CAPS: 25.0000 mg | ORAL_CAPSULE | Freq: Four times a day (QID) | ORAL | Status: DC | PRN

## 2013-03-14 MED ORDER — OXYTOCIN 10 UNIT/ML IJ SOLN
INTRAMUSCULAR | Status: AC
  Filled 2013-03-14: qty 4

## 2013-03-14 MED ORDER — KETOROLAC TROMETHAMINE 30 MG/ML IJ SOLN: 30.0000 mg | Freq: Four times a day (QID) | INTRAMUSCULAR | Status: DC | PRN

## 2013-03-14 MED ORDER — FENTANYL CITRATE 0.05 MG/ML IJ SOLN
INTRAMUSCULAR | Status: AC
  Filled 2013-03-14: qty 2

## 2013-03-14 MED ORDER — FENTANYL CITRATE 0.05 MG/ML IJ SOLN
INTRAMUSCULAR | Status: DC | PRN
  Administered 2013-03-14: 15 ug via INTRATHECAL

## 2013-03-14 MED ORDER — NALBUPHINE SYRINGE 5 MG/0.5 ML
5.0000 mg | INJECTION | INTRAMUSCULAR | Status: DC | PRN
  Filled 2013-03-14: qty 1

## 2013-03-14 MED ORDER — OXYTOCIN 40 UNITS IN LACTATED RINGERS INFUSION - SIMPLE MED: 62.5000 mL/h | INTRAVENOUS | Status: AC

## 2013-03-14 MED ORDER — CEFAZOLIN SODIUM-DEXTROSE 2-3 GM-% IV SOLR
2.0000 g | INTRAVENOUS | Status: AC
  Administered 2013-03-14: 2 g via INTRAVENOUS
  Filled 2013-03-14: qty 50

## 2013-03-14 MED ORDER — MEPERIDINE HCL 25 MG/ML IJ SOLN
INTRAMUSCULAR | Status: AC
  Filled 2013-03-14: qty 1

## 2013-03-14 MED ORDER — DIBUCAINE 1 % RE OINT: 1.0000 "application " | TOPICAL_OINTMENT | RECTAL | Status: DC | PRN

## 2013-03-14 MED ORDER — ONDANSETRON HCL 4 MG/2ML IJ SOLN
INTRAMUSCULAR | Status: DC | PRN
  Administered 2013-03-14: 4 mg via INTRAVENOUS

## 2013-03-14 MED ORDER — SIMETHICONE 80 MG PO CHEW
80.0000 mg | CHEWABLE_TABLET | Freq: Three times a day (TID) | ORAL | Status: DC
  Administered 2013-03-15 – 2013-03-17 (×8): 80 mg via ORAL

## 2013-03-14 MED ORDER — IBUPROFEN 600 MG PO TABS: 600.0000 mg | ORAL_TABLET | Freq: Four times a day (QID) | ORAL | Status: DC | PRN

## 2013-03-14 MED ORDER — FENTANYL CITRATE 0.05 MG/ML IJ SOLN
25.0000 ug | INTRAMUSCULAR | Status: DC | PRN
  Administered 2013-03-14: 50 ug via INTRAVENOUS

## 2013-03-14 MED ORDER — ONDANSETRON HCL 4 MG/2ML IJ SOLN
4.0000 mg | Freq: Three times a day (TID) | INTRAMUSCULAR | Status: DC | PRN
Start: 2013-03-14 — End: 2013-03-14

## 2013-03-14 MED ORDER — FLEET ENEMA 7-19 GM/118ML RE ENEM: 1.0000 | ENEMA | Freq: Every day | RECTAL | Status: DC | PRN

## 2013-03-14 MED ORDER — CITRIC ACID-SODIUM CITRATE 334-500 MG/5ML PO SOLN
ORAL | Status: AC
  Administered 2013-03-14: 30 mL via ORAL
  Filled 2013-03-14: qty 15

## 2013-03-14 MED ORDER — SODIUM CHLORIDE 0.9 % IJ SOLN: 3.0000 mL | Freq: Two times a day (BID) | INTRAMUSCULAR | Status: DC

## 2013-03-14 MED ORDER — SIMETHICONE 80 MG PO CHEW
80.0000 mg | CHEWABLE_TABLET | ORAL | Status: DC | PRN
  Administered 2013-03-14: 80 mg via ORAL

## 2013-03-14 MED ORDER — SCOPOLAMINE 1 MG/3DAYS TD PT72: 1.0000 | MEDICATED_PATCH | Freq: Once | TRANSDERMAL | Status: DC

## 2013-03-14 MED ORDER — KETOROLAC TROMETHAMINE 60 MG/2ML IM SOLN
60.0000 mg | Freq: Once | INTRAMUSCULAR | Status: DC | PRN
  Filled 2013-03-14: qty 2

## 2013-03-14 MED ORDER — NALOXONE HCL 1 MG/ML IJ SOLN
1.0000 ug/kg/h | INTRAVENOUS | Status: DC | PRN
  Filled 2013-03-14: qty 2

## 2013-03-14 MED ORDER — METOCLOPRAMIDE HCL 5 MG/ML IJ SOLN: 10.0000 mg | Freq: Three times a day (TID) | INTRAMUSCULAR | Status: DC | PRN

## 2013-03-14 MED ORDER — ONDANSETRON HCL 4 MG/2ML IJ SOLN
INTRAMUSCULAR | Status: AC
  Filled 2013-03-14: qty 2

## 2013-03-14 MED ORDER — MORPHINE SULFATE 4 MG/ML IJ SOLN
1.0000 mg | INTRAMUSCULAR | Status: AC | PRN
  Administered 2013-03-14 (×2): 1 mg via INTRAVENOUS
  Filled 2013-03-14 (×2): qty 1

## 2013-03-14 MED ORDER — BUPIVACAINE IN DEXTROSE 0.75-8.25 % IT SOLN
INTRATHECAL | Status: DC | PRN
  Administered 2013-03-14: 1.5 mL via INTRATHECAL

## 2013-03-14 MED ORDER — SODIUM CHLORIDE 0.9 % IJ SOLN: 3.0000 mL | INTRAMUSCULAR | Status: DC | PRN

## 2013-03-14 MED ORDER — SENNOSIDES-DOCUSATE SODIUM 8.6-50 MG PO TABS
2.0000 | ORAL_TABLET | Freq: Every day | ORAL | Status: DC
  Administered 2013-03-14 – 2013-03-16 (×2): 2 via ORAL

## 2013-03-14 MED ORDER — MEPERIDINE HCL 25 MG/ML IJ SOLN
6.2500 mg | INTRAMUSCULAR | Status: DC | PRN
  Administered 2013-03-14: 6.25 mg via INTRAVENOUS

## 2013-03-14 MED ORDER — LANOLIN HYDROUS EX OINT: 1.0000 "application " | TOPICAL_OINTMENT | CUTANEOUS | Status: DC | PRN

## 2013-03-14 MED ORDER — DIPHENHYDRAMINE HCL 50 MG/ML IJ SOLN: 12.5000 mg | INTRAMUSCULAR | Status: DC | PRN

## 2013-03-14 MED ORDER — ACETAMINOPHEN 10 MG/ML IV SOLN
1000.0000 mg | Freq: Four times a day (QID) | INTRAVENOUS | Status: AC | PRN
  Administered 2013-03-14: 1000 mg via INTRAVENOUS
  Filled 2013-03-14: qty 100

## 2013-03-14 MED ORDER — ALBUTEROL SULFATE HFA 108 (90 BASE) MCG/ACT IN AERS: 2.0000 | INHALATION_SPRAY | Freq: Four times a day (QID) | RESPIRATORY_TRACT | Status: DC | PRN

## 2013-03-14 MED ORDER — TETANUS-DIPHTH-ACELL PERTUSSIS 5-2.5-18.5 LF-MCG/0.5 IM SUSP: 0.5000 mL | Freq: Once | INTRAMUSCULAR | Status: DC

## 2013-03-14 MED ORDER — DIPHENHYDRAMINE HCL 25 MG PO CAPS
25.0000 mg | ORAL_CAPSULE | ORAL | Status: DC | PRN
  Filled 2013-03-14: qty 1

## 2013-03-14 MED ORDER — MEASLES, MUMPS & RUBELLA VAC ~~LOC~~ INJ: 0.5000 mL | INJECTION | Freq: Once | SUBCUTANEOUS | Status: DC

## 2013-03-14 MED ORDER — NALOXONE HCL 0.4 MG/ML IJ SOLN: 0.4000 mg | INTRAMUSCULAR | Status: DC | PRN

## 2013-03-14 MED ORDER — KETOROLAC TROMETHAMINE 60 MG/2ML IM SOLN
INTRAMUSCULAR | Status: AC
  Administered 2013-03-14: 60 mg
  Filled 2013-03-14: qty 2

## 2013-03-14 MED ORDER — LACTATED RINGERS IV SOLN
INTRAVENOUS | Status: DC | PRN
  Administered 2013-03-14 (×4): via INTRAVENOUS

## 2013-03-14 MED ORDER — CITRIC ACID-SODIUM CITRATE 334-500 MG/5ML PO SOLN: 30.0000 mL | Freq: Once | ORAL | Status: AC

## 2013-03-14 MED ORDER — PRENATAL MULTIVITAMIN CH
1.0000 | ORAL_TABLET | Freq: Every day | ORAL | Status: DC
  Administered 2013-03-14 – 2013-03-16 (×3): 1 via ORAL
  Filled 2013-03-14 (×3): qty 1

## 2013-03-14 MED ORDER — WITCH HAZEL-GLYCERIN EX PADS: 1.0000 "application " | MEDICATED_PAD | CUTANEOUS | Status: DC | PRN

## 2013-03-14 SURGICAL SUPPLY — 25 items
CLOTH BEACON ORANGE TIMEOUT ST (SAFETY) ×2 IMPLANT
DRAPE LG THREE QUARTER DISP (DRAPES) ×2 IMPLANT
DRSG OPSITE POSTOP 4X10 (GAUZE/BANDAGES/DRESSINGS) ×2 IMPLANT
DURAPREP 26ML APPLICATOR (WOUND CARE) ×2 IMPLANT
ELECT REM PT RETURN 9FT ADLT (ELECTROSURGICAL) ×2
ELECTRODE REM PT RTRN 9FT ADLT (ELECTROSURGICAL) ×1 IMPLANT
EXTRACTOR VACUUM M CUP 4 TUBE (SUCTIONS) IMPLANT
GLOVE BIO SURGEON STRL SZ7 (GLOVE) ×2 IMPLANT
GOWN STRL REIN XL XLG (GOWN DISPOSABLE) ×4 IMPLANT
KIT ABG SYR 3ML LUER SLIP (SYRINGE) IMPLANT
NDL HYPO 25X5/8 SAFETYGLIDE (NEEDLE) ×1 IMPLANT
NEEDLE HYPO 25X5/8 SAFETYGLIDE (NEEDLE) ×2 IMPLANT
NS IRRIG 1000ML POUR BTL (IV SOLUTION) ×2 IMPLANT
PACK C SECTION WH (CUSTOM PROCEDURE TRAY) ×2 IMPLANT
PAD OB MATERNITY 4.3X12.25 (PERSONAL CARE ITEMS) ×2 IMPLANT
SLEEVE SCD COMPRESS KNEE MED (MISCELLANEOUS) IMPLANT
STRIP CLOSURE SKIN 1/2X4 (GAUZE/BANDAGES/DRESSINGS) ×2 IMPLANT
SUT CHROMIC 0 CTX 36 (SUTURE) ×6 IMPLANT
SUT MON AB 4-0 PS1 27 (SUTURE) ×2 IMPLANT
SUT PDS AB 0 CT1 27 (SUTURE) ×4 IMPLANT
SUT VIC AB 3-0 CT1 27 (SUTURE) ×4
SUT VIC AB 3-0 CT1 TAPERPNT 27 (SUTURE) ×2 IMPLANT
TOWEL OR 17X24 6PK STRL BLUE (TOWEL DISPOSABLE) ×6 IMPLANT
TRAY FOLEY CATH 14FR (SET/KITS/TRAYS/PACK) ×2 IMPLANT
WATER STERILE IRR 1000ML POUR (IV SOLUTION) ×2 IMPLANT

## 2013-03-14 NOTE — Anesthesia Postprocedure Evaluation (Signed)
Anesthesia Post Note  Patient: Gina Weaver  Procedure(s) Performed: Procedure(s): CESAREAN SECTION (N/A)  Anesthesia type: Spinal  Patient location: Mother Baby  Post pain: Pain level controlled  Post assessment: Post-op Vital signs reviewed  Last Vitals: BP 125/72  Pulse 66  Temp(Src) 36.7 C (Oral)  Resp 18  Ht 5\' 4"  (1.626 m)  Wt 140 lb (63.504 kg)  BMI 24.02 kg/m2  SpO2 96%  LMP 03/08/2012  Post vital signs: Reviewed  Level of consciousness: awake  Complications: No apparent anesthesia complications

## 2013-03-14 NOTE — Transfer of Care (Signed)
Immediate Anesthesia Transfer of Care Note  Patient: Gina Weaver  Procedure(s) Performed: Procedure(s): CESAREAN SECTION (N/A)  Patient Location: PACU  Anesthesia Type:Spinal  Level of Consciousness: awake  Airway & Oxygen Therapy: Patient Spontanous Breathing  Post-op Assessment: Report given to PACU RN  Post vital signs: Reviewed and stable  Complications: No apparent anesthesia complications

## 2013-03-14 NOTE — Op Note (Signed)
preoperative diagnosis: 36+ week IUP, frank breech presentation, oligohydramnios (less than the 3rd percentile)  Postoperative diagnosis: Same  Procedure: Primary low transverse cesarean section  Surgeon: Marcelle Overlie  Anesthesia: Spinal  Complications: None  Specimens removed: Placenta, to pathology  Drains: Foley catheter to straight drain.  Procedure and findings:  The patient taken the operating room after an adequate level of spinal anesthetic was obtained with the patient in left tilt position the abdomen prepped and draped in usual fashion for primary cesarean section. Foley catheter positioned draining clear urine. Appropriate timeout taken at that point.  Transverse incision was made 2 finger breaths above the symphysis carried down to the fascia which was incised and extended transversely. Rectus muscle divided in the midline, peritoneum entered superiorly without incident and extended in a vertical fashion. The vesicouterine serosa was incised and the bladder was bluntly and sharply dissected below, bladder blade repositioned. Transverse incision made in the lower uterine segment extended with bandage scissors, clear fluid noted, frank breech presentation the infant was delivered without difficulty Apgars 9 and 9, loose nuchal cord x1. The infant was suctioned cord cord clamped and passed the pediatric team for further care. The placenta was then delivered manually intact, sent to pathology. Uterus exteriorized cavity wiped clean with laparotomy pack closure transversely of 0 chromic in a locked fashion followed by Dimetane o chromic this is hemostatic bilateral tubes and ovaries were normal. Prior to closure sponge denies precast reported as correct x2. Peritoneum closed with a 2-0 Vicryl suture 2-0 Vicryl interrupted sutures were then used to close the rectus muscles in the midline. Fascia closure laterally to midline on either side with 0 PDS suture. Subcutaneous tissue was hemostatic and  fairly thin. 4-0 Monocryl subcuticular closure with honeycomb dressing. Clear fluid noted at the end of the case mother and baby doing well that point.  Dictated with [facility]  Naeemah Jasmer M. Milana Obey.D.

## 2013-03-14 NOTE — Anesthesia Postprocedure Evaluation (Signed)
  Anesthesia Post Note  Patient: Gina Weaver  Procedure(s) Performed: Procedure(s) (LRB): CESAREAN SECTION (N/A)  Anesthesia type: Spinal  Patient location: PACU  Post pain: Pain level controlled  Post assessment: Post-op Vital signs reviewed  Last Vitals:  Filed Vitals:   03/14/13 0942  BP: 124/74  Pulse: 72  Temp: 36.6 C  Resp: 18    Post vital signs: Reviewed  Level of consciousness: awake  Complications: No apparent anesthesia complications

## 2013-03-14 NOTE — Anesthesia Preprocedure Evaluation (Signed)
Anesthesia Evaluation  Patient identified by MRN, date of birth, ID band Patient awake    Reviewed: Allergy & Precautions, H&P , NPO status , Patient's Chart, lab work & pertinent test results, reviewed documented beta blocker date and time   History of Anesthesia Complications Negative for: history of anesthetic complications  Airway Mallampati: I TM Distance: >3 FB Neck ROM: full    Dental  (+) Teeth Intact   Pulmonary asthma ,  breath sounds clear to auscultation        Cardiovascular negative cardio ROS  Rhythm:regular Rate:Normal     Neuro/Psych negative neurological ROS  negative psych ROS   GI/Hepatic negative GI ROS, Neg liver ROS,   Endo/Other  negative endocrine ROS  Renal/GU negative Renal ROS  negative genitourinary   Musculoskeletal   Abdominal   Peds  Hematology negative hematology ROS (+)   Anesthesia Other Findings   Reproductive/Obstetrics (+) Pregnancy (oligo, breech)                           Anesthesia Physical Anesthesia Plan  ASA: II and emergent  Anesthesia Plan: Spinal   Post-op Pain Management:    Induction:   Airway Management Planned:   Additional Equipment:   Intra-op Plan:   Post-operative Plan:   Informed Consent: I have reviewed the patients History and Physical, chart, labs and discussed the procedure including the risks, benefits and alternatives for the proposed anesthesia with the patient or authorized representative who has indicated his/her understanding and acceptance.     Plan Discussed with: Surgeon and CRNA  Anesthesia Plan Comments:         Anesthesia Quick Evaluation

## 2013-03-14 NOTE — Progress Notes (Signed)
Reviewed US showing persistent (<3%) AFI, frank breech>>rec prim CS, procedure discussed w/ pt /husband

## 2013-03-14 NOTE — Anesthesia Procedure Notes (Signed)
Spinal  Patient location during procedure: OR Start time: 03/14/2013 12:19 PM Staffing Performed by: anesthesiologist  Preanesthetic Checklist Completed: patient identified, site marked, surgical consent, pre-op evaluation, timeout performed, IV checked, risks and benefits discussed and monitors and equipment checked Spinal Block Patient position: sitting Prep: site prepped and draped and DuraPrep Patient monitoring: heart rate, cardiac monitor, continuous pulse ox and blood pressure Approach: midline Location: L3-4 Injection technique: single-shot Needle Needle type: Sprotte  Needle gauge: 24 G Needle length: 9 cm Assessment Sensory level: T4 Additional Notes Clear free flow CSF on first attempt.  No paresthesia.  Patient tolerated procedure well.  Jasmine December, MD

## 2013-03-14 NOTE — Addendum Note (Signed)
Addendum created 03/14/13 1350 by Suella Grove, CRNA   Modules edited: Anesthesia Medication Administration

## 2013-03-15 LAB — CBC
MCH: 30.2 pg (ref 26.0–34.0)
MCHC: 33.3 g/dL (ref 30.0–36.0)
Platelets: 167 10*3/uL (ref 150–400)

## 2013-03-15 LAB — RPR: RPR Ser Ql: NONREACTIVE

## 2013-03-15 NOTE — Lactation Note (Signed)
This note was copied from the chart of Gina Weaver. Lactation Consultation Note Follow up consult to assess baby's feeding and intake. Baby asleep in bassinet, many visitors in room, offered to assist mom with a feeding, mom accepts and request visitors to leave. FOB remains at bedside and is supportive. Mom performed hand expression, colostrum is flowing. Attempted to latch baby to breast using a #16 nipple shield, baby did not latch to nipple shield. Attempted to latch baby to breast, baby became stressed and began breathing loudly and rapidly, remained pink. Placed baby STS and allowed her to calm down. Advised mom that baby is not ready to latch at this time without becoming stressed. Offered feeding alternatives, and mom and dad choose to hand express, and feed using syringe.  Mom hand expressed 1 mL colostrum. Demonstrated to mom and dad how to finger feed baby using curved tip syringe, supplementing with 6mL formula. Baby was then very relaxed, sleeping, pink and breathing was regular.  Written feeding plan provided, including volume parameters, and reviewed with mom and dad. Mom and dad understand and agree with the plan. Will f/u this evening as requested by MD.   Patient Name: Gina Weaver Today's Date: 03/15/2013 Reason for consult: Follow-up assessment;Infant < 6lbs;Late preterm infant   Maternal Data    Feeding Feeding Type: Breast Milk Feeding method: Finger  LATCH Score/Interventions Latch: Too sleepy or reluctant, no latch achieved, no sucking elicited.                    Lactation Tools Discussed/Used Tools: Nipple Shields Nipple shield size: 16   Consult Status Consult Status: Follow-up Follow-up type: In-patient    Octavio Manns [facility] 03/15/2013, 12:59 PM

## 2013-03-15 NOTE — Progress Notes (Signed)
Subjective: Postpartum Day 1: Cesarean Delivery Patient reports tolerating PO.    Objective: Vital signs in last 24 hours: Temp:  [97 F (36.1 C)-99 F (37.2 C)] 98.1 F (36.7 C) (03/29 0457) Pulse Rate:  [64-79] 73 (03/29 0457) Resp:  [14-22] 18 (03/29 0457) BP: (102-131)/(46-77) 102/65 mmHg (03/29 0457) SpO2:  [95 %-100 %] 96 % (03/29 0457)  Physical Exam:  General: alert Lochia: appropriate Uterine Fundus: firm Incision: healing well DVT Evaluation: No evidence of DVT seen on physical exam.   Recent Labs  03/14/13 1058 03/15/13 0615  HGB 11.5* 10.4*  HCT 33.9* 31.2*    Assessment/Plan: Status post Cesarean section. Doing well postoperatively.  Continue current care.  Meriel Pica 03/15/2013, 7:33 AM

## 2013-03-17 ENCOUNTER — Encounter (HOSPITAL_COMMUNITY): Payer: Self-pay | Admitting: Obstetrics and Gynecology

## 2013-03-17 MED ORDER — OXYCODONE-ACETAMINOPHEN 5-325 MG PO TABS: 1.0000 | ORAL_TABLET | Freq: Four times a day (QID) | ORAL | Status: DC | PRN

## 2013-03-17 MED ORDER — IBUPROFEN 800 MG PO TABS: 800.0000 mg | ORAL_TABLET | Freq: Three times a day (TID) | ORAL | Status: DC | PRN

## 2013-03-17 NOTE — Discharge Summary (Signed)
Obstetric Discharge Summary Reason for Admission: observation/evaluation Prenatal Procedures: ultrasound Intrapartum Procedures: cesarean: low cervical, transverse Postpartum Procedures: none Complications-Operative and Postpartum: none Hemoglobin  Date Value Range Status  03/15/2013 10.4* 12.0 - 15.0 g/dL Final     HCT  Date Value Range Status  03/15/2013 31.2* 36.0 - 46.0 % Final    Physical Exam:  General: alert and cooperative Lochia: appropriate Uterine Fundus: firm Incision: honeycomb dressing CDI DVT Evaluation: No evidence of DVT seen on physical exam. Negative Homan's sign. No cords or calf tenderness. No significant calf/ankle edema.  Discharge Diagnoses: s/p cesarean delivery , 36 weeks breech presentation  Discharge Information: Date: 03/17/2013 Activity: pelvic rest Diet: routine Medications: PNV, Ibuprofen and Percocet Condition: stable Instructions: refer to practice specific booklet Discharge to: home   Newborn Data: Live born female  Birth Weight: 5 lb 13.7 oz (2655 g) APGAR: 9, 9  Home with baby in NICU on nasal o2, currently on ATB's for pneumonia.  CURTIS,CAROL G 03/17/2013, 8:36 AM

## 2013-11-03 ENCOUNTER — Telehealth: Payer: Self-pay | Admitting: Adult Health

## 2013-11-03 ENCOUNTER — Ambulatory Visit (INDEPENDENT_AMBULATORY_CARE_PROVIDER_SITE_OTHER): Payer: BC Managed Care – PPO | Admitting: Adult Health

## 2013-11-03 ENCOUNTER — Encounter: Payer: Self-pay | Admitting: Adult Health

## 2013-11-03 VITALS — BP 100/60 | Ht 63.0 in | Wt 118.0 lb

## 2013-11-03 DIAGNOSIS — N63 Unspecified lump in unspecified breast: Secondary | ICD-10-CM | POA: Insufficient documentation

## 2013-11-03 DIAGNOSIS — N6459 Other signs and symptoms in breast: Secondary | ICD-10-CM

## 2013-11-03 HISTORY — DX: Unspecified lump in unspecified breast: N63.0

## 2013-11-03 NOTE — Progress Notes (Signed)
Subjective:     Patient ID: Gina Weaver, female   DOB: October 23, 1986, 27 y.o.   MRN: [id number]  HPI Gina Weaver is a 27 year old white female in complaining of having found nodule right breast this Saturday.  She is not breast feeding any more.  Review of Systems See HPI Reviewed past medical,surgical, social and family history. Reviewed medications and allergies.     Objective:   Physical Exam BP 100/60  Ht 5\' 3"  (1.6 m)  Wt 118 lb (53.524 kg)  BMI 20.91 kg/m2  LMP 10/13/2013  Breastfeeding? No    Skin warm and dry,  Breasts:no dominate palpable mass, retraction or nipple discharge on left breast, on the right no retraction or nipple discharge but here is a pea sized nodule at about 10 o'clock UOQ   Assessment:     Right breast nodule    Plan:     Right breast US 11/19 at 9 am at [facility] Follow up prn

## 2013-11-03 NOTE — Patient Instructions (Signed)
Get [place] 11/19 at 9 am  be there at 8:45 am [facility]

## 2013-11-03 NOTE — Telephone Encounter (Signed)
Had questions the were answered about breast US

## 2013-11-05 ENCOUNTER — Ambulatory Visit (HOSPITAL_COMMUNITY)
Admission: RE | Admit: 2013-11-05 | Discharge: 2013-11-05 | Disposition: A | Discharge status: Home / Self Care | Payer: BC Managed Care – PPO | Source: Ambulatory Visit | Attending: Adult Health | Admitting: Adult Health

## 2013-11-05 ENCOUNTER — Telehealth: Payer: Self-pay | Admitting: Adult Health

## 2013-11-05 DIAGNOSIS — N63 Unspecified lump in unspecified breast: Secondary | ICD-10-CM

## 2013-11-05 NOTE — Telephone Encounter (Signed)
Gina Weaver spoke with pt

## 2013-11-05 NOTE — Telephone Encounter (Signed)
Left message US normal 

## 2014-04-10 ENCOUNTER — Other Ambulatory Visit (HOSPITAL_COMMUNITY): Payer: Self-pay | Admitting: Internal Medicine

## 2014-04-10 DIAGNOSIS — R1011 Right upper quadrant pain: Secondary | ICD-10-CM

## 2014-04-13 ENCOUNTER — Other Ambulatory Visit (HOSPITAL_COMMUNITY): Payer: Self-pay | Admitting: Internal Medicine

## 2014-04-13 ENCOUNTER — Ambulatory Visit (HOSPITAL_COMMUNITY)
Admission: RE | Admit: 2014-04-13 | Discharge: 2014-04-13 | Disposition: A | Discharge status: Home / Self Care | Payer: BC Managed Care – PPO | Source: Ambulatory Visit | Attending: Internal Medicine | Admitting: Internal Medicine

## 2014-04-13 DIAGNOSIS — R1011 Right upper quadrant pain: Secondary | ICD-10-CM

## 2014-10-19 ENCOUNTER — Encounter: Payer: Self-pay | Admitting: Adult Health

## 2014-10-29 LAB — OB RESULTS CONSOLE ABO/RH: RH TYPE: POSITIVE

## 2014-10-29 LAB — OB RESULTS CONSOLE ANTIBODY SCREEN: Antibody Screen: NEGATIVE

## 2014-10-29 LAB — OB RESULTS CONSOLE GC/CHLAMYDIA
CHLAMYDIA, DNA PROBE: NEGATIVE
GC PROBE AMP, GENITAL: NEGATIVE

## 2014-10-29 LAB — OB RESULTS CONSOLE RUBELLA ANTIBODY, IGM: Rubella: IMMUNE

## 2014-10-29 LAB — OB RESULTS CONSOLE HEPATITIS B SURFACE ANTIGEN: HEP B S AG: NEGATIVE

## 2014-10-29 LAB — OB RESULTS CONSOLE RPR: RPR: NONREACTIVE

## 2014-10-29 LAB — OB RESULTS CONSOLE HIV ANTIBODY (ROUTINE TESTING): HIV: NONREACTIVE

## 2014-11-10 ENCOUNTER — Other Ambulatory Visit: Payer: Self-pay | Admitting: Obstetrics and Gynecology

## 2014-11-13 LAB — CYTOLOGY - PAP

## 2015-05-21 NOTE — H&P (Addendum)
Gina FerrariBrianna W Weaver is a 29 y.o. female presenting for repeat c-section.  Pregnancy uncomplicated.  History OB History    Gravida Para Term Preterm AB TAB SAB Ectopic Multiple Living   2 1 0 1 1 0 1 0 0 1      Past Medical History  Diagnosis Date  . Asthma   . No pertinent past medical history   . Kidney stone 2003, 2013    passed  . Breast nodule 11/03/2013    Has pea sized area right breast upper outer quad at 10 o'clock   Past Surgical History  Procedure Laterality Date  . Nasal sinus surgery    . Femur fracture surgery  1991    Pt fell off deck, pelvic surgery  . Dilation and curettage of uterus    . Fracture surgery    . Cesarean section N/A 03/14/2013    Procedure: CESAREAN SECTION;  Surgeon: Meriel Picaichard M Holland, MD;  Location: [facility] ORS;  Service: Obstetrics;  Laterality: N/A;   Family History: family history includes Cancer in her father; Diabetes in her maternal grandfather, maternal grandmother, and mother; Non-Hodgkin's lymphoma in her paternal grandmother. Social History:  reports that she has never smoked. She has never used smokeless tobacco. She reports that she does not drink alcohol or use illicit drugs.   Prenatal Transfer Tool  Maternal Diabetes: No Genetic Screening: Declined Maternal Ultrasounds/Referrals: Normal Fetal Ultrasounds or other Referrals:  None Maternal Substance Abuse:  No Significant Maternal Medications:  None Significant Maternal Lab Results:  None Other Comments:  None  ROS    not currently breastfeeding. Exam Physical Exam  AF, VSS Gen - NAD ABd - gravid, NT Ext - NT Prenatal labs: ABO, Rh:   Antibody:   Rubella:   RPR:    HBsAg:    HIV:    GBS:     Assessment/Plan: Previous c-section, desires rpt  R/b/a discussed, questions answered, informed consent  Gina Weaver 05/21/2015, 11:09 AM

## 2015-05-24 ENCOUNTER — Encounter (HOSPITAL_COMMUNITY): Payer: Self-pay

## 2015-05-25 ENCOUNTER — Encounter (HOSPITAL_COMMUNITY)
Admission: RE | Admit: 2015-05-25 | Discharge: 2015-05-25 | Disposition: A | Discharge status: Home / Self Care | Payer: 59 | Source: Ambulatory Visit | Attending: Obstetrics and Gynecology | Admitting: Obstetrics and Gynecology

## 2015-05-25 ENCOUNTER — Encounter (HOSPITAL_COMMUNITY): Payer: Self-pay

## 2015-05-25 LAB — CBC
HEMATOCRIT: 35.6 % — AB (ref 36.0–46.0)
HEMOGLOBIN: 11.9 g/dL — AB (ref 12.0–15.0)
MCH: 30.1 pg (ref 26.0–34.0)
MCHC: 33.4 g/dL (ref 30.0–36.0)
MCV: 89.9 fL (ref 78.0–100.0)
Platelets: 186 10*3/uL (ref 150–400)
RBC: 3.96 MIL/uL (ref 3.87–5.11)
RDW: 14.1 % (ref 11.5–15.5)
WBC: 9.7 10*3/uL (ref 4.0–10.5)

## 2015-05-25 LAB — TYPE AND SCREEN
ABO/RH(D): O POS
ANTIBODY SCREEN: NEGATIVE

## 2015-05-25 NOTE — Patient Instructions (Signed)
Your procedure is scheduled on:  May 27, 2015  [facility] at:  0600 am   Pick up the phone at the desk and dial [phone].  Call this number if you have problems the morning of surgery: [phone].  Remember: Do NOT eat food:  After midnight on Wednesday  Do NOT drink clear liquids after:  Midnight on Wednesday  Take these medicines the morning of surgery with a SIP OF WATER:  None  Bring Inhaler with you day of surgery   Do NOT wear jewelry (body piercing), metal hair clips/bobby pins, or nail polish. Do NOT wear lotions, powders, or perfumes.  You may wear deoderant. Do NOT shave for 48 hours prior to surgery. Do NOT bring valuables to the hospital. Contacts, dentures, or bridgework may not be worn into surgery. Leave suitcase in car.  After surgery it may be brought to your room.  For patients admitted to the hospital, checkout time is 11:00 AM the day of discharge.

## 2015-05-26 LAB — RPR: RPR: NONREACTIVE

## 2015-05-27 ENCOUNTER — Inpatient Hospital Stay (HOSPITAL_COMMUNITY): Payer: 59 | Admitting: Anesthesiology

## 2015-05-27 ENCOUNTER — Encounter (HOSPITAL_COMMUNITY)
Admission: AD | Disposition: A | Discharge status: Home / Self Care | Payer: Self-pay | Source: Ambulatory Visit | Attending: Obstetrics and Gynecology

## 2015-05-27 ENCOUNTER — Inpatient Hospital Stay (HOSPITAL_COMMUNITY)
Admission: AD | Admit: 2015-05-27 | Discharge: 2015-05-29 | DRG: 766 | Disposition: A | Discharge status: Home / Self Care | Payer: 59 | Source: Ambulatory Visit | Attending: Obstetrics and Gynecology | Admitting: Obstetrics and Gynecology

## 2015-05-27 ENCOUNTER — Encounter (HOSPITAL_COMMUNITY): Payer: Self-pay | Admitting: Anesthesiology

## 2015-05-27 DIAGNOSIS — Z3A39 39 weeks gestation of pregnancy: Secondary | ICD-10-CM | POA: Diagnosis present

## 2015-05-27 DIAGNOSIS — O9952 Diseases of the respiratory system complicating childbirth: Secondary | ICD-10-CM | POA: Diagnosis present

## 2015-05-27 DIAGNOSIS — Z98891 History of uterine scar from previous surgery: Secondary | ICD-10-CM

## 2015-05-27 DIAGNOSIS — N858 Other specified noninflammatory disorders of uterus: Secondary | ICD-10-CM | POA: Diagnosis present

## 2015-05-27 DIAGNOSIS — O3421 Maternal care for scar from previous cesarean delivery: Secondary | ICD-10-CM | POA: Diagnosis present

## 2015-05-27 DIAGNOSIS — O321XX0 Maternal care for breech presentation, not applicable or unspecified: Secondary | ICD-10-CM | POA: Diagnosis present

## 2015-05-27 DIAGNOSIS — J45909 Unspecified asthma, uncomplicated: Secondary | ICD-10-CM | POA: Diagnosis present

## 2015-05-27 SURGERY — Surgical Case
Anesthesia: Spinal | Site: Abdomen

## 2015-05-27 MED ORDER — NALOXONE HCL 1 MG/ML IJ SOLN
1.0000 ug/kg/h | INTRAMUSCULAR | Status: DC | PRN
  Filled 2015-05-27: qty 2

## 2015-05-27 MED ORDER — TETANUS-DIPHTH-ACELL PERTUSSIS 5-2.5-18.5 LF-MCG/0.5 IM SUSP: 0.5000 mL | Freq: Once | INTRAMUSCULAR | Status: DC

## 2015-05-27 MED ORDER — CEFAZOLIN SODIUM-DEXTROSE 2-3 GM-% IV SOLR
INTRAVENOUS | Status: AC
  Filled 2015-05-27: qty 50

## 2015-05-27 MED ORDER — FENTANYL CITRATE (PF) 100 MCG/2ML IJ SOLN: 25.0000 ug | INTRAMUSCULAR | Status: DC | PRN

## 2015-05-27 MED ORDER — IBUPROFEN 600 MG PO TABS
600.0000 mg | ORAL_TABLET | Freq: Four times a day (QID) | ORAL | Status: DC
  Administered 2015-05-27 – 2015-05-29 (×7): 600 mg via ORAL
  Filled 2015-05-27 (×7): qty 1

## 2015-05-27 MED ORDER — DIBUCAINE 1 % RE OINT: 1.0000 "application " | TOPICAL_OINTMENT | RECTAL | Status: DC | PRN

## 2015-05-27 MED ORDER — SIMETHICONE 80 MG PO CHEW: 80.0000 mg | CHEWABLE_TABLET | ORAL | Status: DC | PRN

## 2015-05-27 MED ORDER — NALBUPHINE HCL 10 MG/ML IJ SOLN: 5.0000 mg | Freq: Once | INTRAMUSCULAR | Status: AC | PRN

## 2015-05-27 MED ORDER — CLINDAMYCIN PHOSPHATE 900 MG/50ML IV SOLN
900.0000 mg | Freq: Once | INTRAVENOUS | Status: AC
  Administered 2015-05-27: 900 mg via INTRAVENOUS
  Filled 2015-05-27: qty 50

## 2015-05-27 MED ORDER — SCOPOLAMINE 1 MG/3DAYS TD PT72: 1.0000 | MEDICATED_PATCH | Freq: Once | TRANSDERMAL | Status: DC

## 2015-05-27 MED ORDER — OXYTOCIN 10 UNIT/ML IJ SOLN
INTRAMUSCULAR | Status: AC
Start: 2015-05-27 — End: 2015-05-27
  Filled 2015-05-27: qty 1

## 2015-05-27 MED ORDER — MEPERIDINE HCL 25 MG/ML IJ SOLN: 6.2500 mg | INTRAMUSCULAR | Status: DC | PRN

## 2015-05-27 MED ORDER — OXYTOCIN 40 UNITS IN LACTATED RINGERS INFUSION - SIMPLE MED: 62.5000 mL/h | INTRAVENOUS | Status: AC

## 2015-05-27 MED ORDER — SODIUM CHLORIDE 0.9 % IJ SOLN: 3.0000 mL | INTRAMUSCULAR | Status: DC | PRN

## 2015-05-27 MED ORDER — ACETAMINOPHEN 325 MG PO TABS
650.0000 mg | ORAL_TABLET | Freq: Once | ORAL | Status: AC
  Administered 2015-05-27: 650 mg via ORAL
  Filled 2015-05-27: qty 2

## 2015-05-27 MED ORDER — LACTATED RINGERS IV SOLN
Freq: Once | INTRAVENOUS | Status: AC
  Administered 2015-05-27: 07:00:00 via INTRAVENOUS

## 2015-05-27 MED ORDER — ONDANSETRON HCL 4 MG/2ML IJ SOLN
INTRAMUSCULAR | Status: DC | PRN
  Administered 2015-05-27: 4 mg via INTRAVENOUS

## 2015-05-27 MED ORDER — DEXTROSE IN LACTATED RINGERS 5 % IV SOLN
INTRAVENOUS | Status: DC
  Administered 2015-05-27: 18:00:00 via INTRAVENOUS

## 2015-05-27 MED ORDER — DIPHENHYDRAMINE HCL 25 MG PO CAPS: 25.0000 mg | ORAL_CAPSULE | Freq: Four times a day (QID) | ORAL | Status: DC | PRN

## 2015-05-27 MED ORDER — LACTATED RINGERS IV SOLN: INTRAVENOUS | Status: DC

## 2015-05-27 MED ORDER — KETOROLAC TROMETHAMINE 30 MG/ML IJ SOLN
30.0000 mg | Freq: Four times a day (QID) | INTRAMUSCULAR | Status: AC | PRN
  Administered 2015-05-27: 30 mg via INTRAMUSCULAR

## 2015-05-27 MED ORDER — MORPHINE SULFATE 0.5 MG/ML IJ SOLN
INTRAMUSCULAR | Status: AC
  Filled 2015-05-27: qty 10

## 2015-05-27 MED ORDER — ONDANSETRON HCL 4 MG/2ML IJ SOLN: 4.0000 mg | Freq: Three times a day (TID) | INTRAMUSCULAR | Status: DC | PRN

## 2015-05-27 MED ORDER — DIPHENHYDRAMINE HCL 50 MG/ML IJ SOLN: 12.5000 mg | INTRAMUSCULAR | Status: DC | PRN

## 2015-05-27 MED ORDER — PHENYLEPHRINE HCL 10 MG/ML IJ SOLN
INTRAMUSCULAR | Status: DC | PRN
  Administered 2015-05-27: 80 ug via INTRAVENOUS

## 2015-05-27 MED ORDER — FENTANYL CITRATE (PF) 100 MCG/2ML IJ SOLN
INTRAMUSCULAR | Status: AC
  Filled 2015-05-27: qty 2

## 2015-05-27 MED ORDER — DIPHENHYDRAMINE HCL 25 MG PO CAPS
25.0000 mg | ORAL_CAPSULE | ORAL | Status: DC | PRN
  Filled 2015-05-27: qty 1

## 2015-05-27 MED ORDER — SIMETHICONE 80 MG PO CHEW
80.0000 mg | CHEWABLE_TABLET | ORAL | Status: DC
  Administered 2015-05-27 – 2015-05-29 (×2): 80 mg via ORAL
  Filled 2015-05-27 (×2): qty 1

## 2015-05-27 MED ORDER — OXYTOCIN 10 UNIT/ML IJ SOLN
40.0000 [IU] | INTRAVENOUS | Status: DC | PRN
  Administered 2015-05-27: 40 [IU] via INTRAVENOUS

## 2015-05-27 MED ORDER — KETOROLAC TROMETHAMINE 30 MG/ML IJ SOLN
INTRAMUSCULAR | Status: AC
  Filled 2015-05-27: qty 1

## 2015-05-27 MED ORDER — KETOROLAC TROMETHAMINE 30 MG/ML IJ SOLN: 30.0000 mg | Freq: Four times a day (QID) | INTRAMUSCULAR | Status: AC | PRN

## 2015-05-27 MED ORDER — LIDOCAINE HCL (CARDIAC) 20 MG/ML IV SOLN
INTRAVENOUS | Status: AC
Start: 2015-05-27 — End: 2015-05-27
  Filled 2015-05-27: qty 5

## 2015-05-27 MED ORDER — NALOXONE HCL 0.4 MG/ML IJ SOLN: 0.4000 mg | INTRAMUSCULAR | Status: DC | PRN

## 2015-05-27 MED ORDER — SCOPOLAMINE 1 MG/3DAYS TD PT72
MEDICATED_PATCH | TRANSDERMAL | Status: DC
Start: 2015-05-27 — End: 2015-05-29
  Administered 2015-05-27: 1.5 mg via TRANSDERMAL
  Filled 2015-05-27: qty 1

## 2015-05-27 MED ORDER — IBUPROFEN 600 MG PO TABS: 600.0000 mg | ORAL_TABLET | Freq: Four times a day (QID) | ORAL | Status: DC | PRN

## 2015-05-27 MED ORDER — FENTANYL CITRATE (PF) 100 MCG/2ML IJ SOLN
INTRAMUSCULAR | Status: DC | PRN
  Administered 2015-05-27: 25 ug via INTRAVENOUS

## 2015-05-27 MED ORDER — MEASLES, MUMPS & RUBELLA VAC ~~LOC~~ INJ
0.5000 mL | INJECTION | Freq: Once | SUBCUTANEOUS | Status: DC
  Filled 2015-05-27: qty 0.5

## 2015-05-27 MED ORDER — LANOLIN HYDROUS EX OINT: 1.0000 "application " | TOPICAL_OINTMENT | CUTANEOUS | Status: DC | PRN

## 2015-05-27 MED ORDER — ZOLPIDEM TARTRATE 5 MG PO TABS: 5.0000 mg | ORAL_TABLET | Freq: Every evening | ORAL | Status: DC | PRN

## 2015-05-27 MED ORDER — LACTATED RINGERS IV SOLN
INTRAVENOUS | Status: DC | PRN
  Administered 2015-05-27: 08:00:00 via INTRAVENOUS

## 2015-05-27 MED ORDER — ONDANSETRON HCL 4 MG/2ML IJ SOLN
INTRAMUSCULAR | Status: AC
  Filled 2015-05-27: qty 2

## 2015-05-27 MED ORDER — 0.9 % SODIUM CHLORIDE (POUR BTL) OPTIME
TOPICAL | Status: DC | PRN
  Administered 2015-05-27: 1000 mL

## 2015-05-27 MED ORDER — ALBUTEROL SULFATE (2.5 MG/3ML) 0.083% IN NEBU: 2.5000 mg | INHALATION_SOLUTION | Freq: Four times a day (QID) | RESPIRATORY_TRACT | Status: DC | PRN

## 2015-05-27 MED ORDER — SENNOSIDES-DOCUSATE SODIUM 8.6-50 MG PO TABS
2.0000 | ORAL_TABLET | ORAL | Status: DC
  Administered 2015-05-27 – 2015-05-29 (×2): 2 via ORAL
  Filled 2015-05-27 (×2): qty 2

## 2015-05-27 MED ORDER — OXYCODONE-ACETAMINOPHEN 5-325 MG PO TABS: 2.0000 | ORAL_TABLET | ORAL | Status: DC | PRN

## 2015-05-27 MED ORDER — OXYCODONE-ACETAMINOPHEN 5-325 MG PO TABS
1.0000 | ORAL_TABLET | ORAL | Status: DC | PRN
  Administered 2015-05-28 – 2015-05-29 (×2): 1 via ORAL
  Filled 2015-05-27 (×2): qty 1

## 2015-05-27 MED ORDER — LACTATED RINGERS IV SOLN
INTRAVENOUS | Status: DC | PRN
  Administered 2015-05-27 (×3): via INTRAVENOUS

## 2015-05-27 MED ORDER — WITCH HAZEL-GLYCERIN EX PADS: 1.0000 "application " | MEDICATED_PAD | CUTANEOUS | Status: DC | PRN

## 2015-05-27 MED ORDER — NALBUPHINE HCL 10 MG/ML IJ SOLN: 5.0000 mg | INTRAMUSCULAR | Status: DC | PRN

## 2015-05-27 MED ORDER — MORPHINE SULFATE (PF) 0.5 MG/ML IJ SOLN
INTRAMUSCULAR | Status: DC | PRN
  Administered 2015-05-27: .1 mg via EPIDURAL

## 2015-05-27 MED ORDER — PHENYLEPHRINE 8 MG IN D5W 100 ML (0.08MG/ML) PREMIX OPTIME
INJECTION | INTRAVENOUS | Status: DC | PRN
  Administered 2015-05-27: 60 ug/min via INTRAVENOUS

## 2015-05-27 MED ORDER — MEDROXYPROGESTERONE ACETATE 150 MG/ML IM SUSP: 150.0000 mg | INTRAMUSCULAR | Status: DC | PRN

## 2015-05-27 MED ORDER — PRENATAL MULTIVITAMIN CH
1.0000 | ORAL_TABLET | Freq: Every day | ORAL | Status: DC
  Administered 2015-05-28: 1 via ORAL
  Filled 2015-05-27: qty 1

## 2015-05-27 MED ORDER — SIMETHICONE 80 MG PO CHEW
80.0000 mg | CHEWABLE_TABLET | Freq: Three times a day (TID) | ORAL | Status: DC
  Administered 2015-05-27 – 2015-05-29 (×5): 80 mg via ORAL
  Filled 2015-05-27 (×5): qty 1

## 2015-05-27 MED ORDER — MENTHOL 3 MG MT LOZG: 1.0000 | LOZENGE | OROMUCOSAL | Status: DC | PRN

## 2015-05-27 SURGICAL SUPPLY — 21 items
CLAMP CORD UMBIL (MISCELLANEOUS) ×1 IMPLANT
CLOTH BEACON ORANGE TIMEOUT ST (SAFETY) ×2 IMPLANT
DRAPE SHEET LG 3/4 BI-LAMINATE (DRAPES) ×1 IMPLANT
DRSG OPSITE POSTOP 4X10 (GAUZE/BANDAGES/DRESSINGS) ×2 IMPLANT
DURAPREP 26ML APPLICATOR (WOUND CARE) ×2 IMPLANT
ELECT REM PT RETURN 9FT ADLT (ELECTROSURGICAL) ×2
ELECTRODE REM PT RTRN 9FT ADLT (ELECTROSURGICAL) ×1 IMPLANT
GLOVE BIO SURGEON STRL SZ 6.5 (GLOVE) ×2 IMPLANT
GLOVE BIOGEL PI IND STRL 7.0 (GLOVE) ×1 IMPLANT
GLOVE BIOGEL PI INDICATOR 7.0 (GLOVE) ×1
GOWN STRL REUS W/TWL LRG LVL3 (GOWN DISPOSABLE) ×4 IMPLANT
LIQUID BAND (GAUZE/BANDAGES/DRESSINGS) ×2 IMPLANT
NS IRRIG 1000ML POUR BTL (IV SOLUTION) ×2 IMPLANT
PACK C SECTION WH (CUSTOM PROCEDURE TRAY) ×2 IMPLANT
PAD OB MATERNITY 4.3X12.25 (PERSONAL CARE ITEMS) ×2 IMPLANT
SUT CHROMIC 0 CTX 36 (SUTURE) ×6 IMPLANT
SUT MON AB-0 CT1 36 (SUTURE) ×2 IMPLANT
SUT PDS AB 0 CTX 60 (SUTURE) ×2 IMPLANT
SUT VIC AB 4-0 KS 27 (SUTURE) ×1 IMPLANT
TOWEL OR 17X24 6PK STRL BLUE (TOWEL DISPOSABLE) ×2 IMPLANT
TRAY FOLEY CATH SILVER 14FR (SET/KITS/TRAYS/PACK) ×1 IMPLANT

## 2015-05-27 NOTE — Anesthesia Procedure Notes (Signed)
Spinal Patient location during procedure: OR Preanesthetic Checklist Completed: patient identified, site marked, surgical consent, pre-op evaluation, timeout performed, IV checked, risks and benefits discussed and monitors and equipment checked Spinal Block Patient position: sitting Prep: DuraPrep Patient monitoring: heart rate, cardiac monitor, continuous pulse ox and blood pressure Approach: midline Location: L3-4 Injection technique: single-shot Needle Needle type: Sprotte  Needle gauge: 24 G Needle length: 9 cm Assessment Sensory level: T4 Additional Notes Spinal Dosage in OR  Bupivicaine ml       1.5 PFMS04   mcg        100 Fentanyl mcg            25    

## 2015-05-27 NOTE — Anesthesia Postprocedure Evaluation (Signed)
  Anesthesia Post-op Note  Patient: Gina Weaver  Procedure(s) Performed: Procedure(s) with comments: REPEAT CESAREAN SECTION (N/A) - 6 15 16  Tracie.S. to RNFA confirmed on 05/24/2015 with Donne Hazel RN   Patient Location: Mother/Baby  Anesthesia Type:Spinal  Level of Consciousness: awake, alert , oriented and patient cooperative  Airway and Oxygen Therapy: Patient Spontanous Breathing  Post-op Pain: none  Post-op Assessment: Post-op Vital signs reviewed, Patient's Cardiovascular Status Stable, Respiratory Function Stable, Patent Airway, No headache, No backache and Patient able to bend at knees   LLE Sensation: Tingling   RLE Sensation: Tingling      Post-op Vital Signs: Reviewed and stable  Last Vitals:  Filed Vitals:   05/27/15 1326  BP: 102/62  Pulse: 71  Temp:   Resp: 18    Complications: No apparent anesthesia complications

## 2015-05-27 NOTE — Anesthesia Preprocedure Evaluation (Signed)
Anesthesia Evaluation  Patient identified by MRN, date of birth, ID band Patient awake    Reviewed: Allergy & Precautions, NPO status , Patient's Chart, lab work & pertinent test results  Airway Mallampati: I  TM Distance: >3 FB Neck ROM: Full    Dental no notable dental hx. (+) Teeth Intact   Pulmonary asthma ,  breath sounds clear to auscultation  Pulmonary exam normal       Cardiovascular negative cardio ROS Normal cardiovascular examRhythm:Regular Rate:Normal     Neuro/Psych negative neurological ROS  negative psych ROS   GI/Hepatic negative GI ROS, Neg liver ROS,   Endo/Other  negative endocrine ROS  Renal/GU Renal diseaseHx/o renal calculi  negative genitourinary   Musculoskeletal negative musculoskeletal ROS (+)   Abdominal   Peds  Hematology negative hematology ROS (+)   Anesthesia Other Findings   Reproductive/Obstetrics (+) Pregnancy Previous C/Section                             Anesthesia Physical Anesthesia Plan  ASA: II  Anesthesia Plan: Spinal   Post-op Pain Management:    Induction:   Airway Management Planned: Natural Airway  Additional Equipment:   Intra-op Plan:   Post-operative Plan:   Informed Consent: I have reviewed the patients History and Physical, chart, labs and discussed the procedure including the risks, benefits and alternatives for the proposed anesthesia with the patient or authorized representative who has indicated his/her understanding and acceptance.     Plan Discussed with: Anesthesiologist, CRNA and Surgeon  Anesthesia Plan Comments:         Anesthesia Quick Evaluation

## 2015-05-27 NOTE — Lactation Note (Signed)
This note was copied from the chart of Gina Weaver. Lactation Consultation Note Initial visit at 13 hours of age.  Mom reports a few good feedings.  FOB holding baby now, gaggy and spitty small amounts of clear mucous with suction of mouth required.  Parents know to push emergency bell as needed.  [facility] LC resources given and discussed.  Encouraged to feed with early cues on demand.  Early newborn behavior discussed.  Hand expression demonstrated by mom with colostrum visible.  Mom to call for assist as needed.    Patient Name: Gina Weaver [id number] Date: 05/27/2015 Reason for consult: Initial assessment   Maternal Data Has patient been taught Hand Expression?: Yes Does the patient have breastfeeding experience prior to this delivery?: Yes  Feeding Feeding Type: Breast Fed Length of feed: 20 min  LATCH Score/Interventions                Intervention(s): Breastfeeding basics reviewed     Lactation Tools Discussed/Used WIC Program: No   Consult Status Consult Status: Follow-up Date: 05/28/15 Follow-up type: In-patient    Shoptaw, Arvella Merles 05/27/2015, 8:55 PM

## 2015-05-27 NOTE — Anesthesia Postprocedure Evaluation (Signed)
  Anesthesia Post-op Note  Patient: Gina Weaver  Procedure(s) Performed: Procedure(s) with comments: REPEAT CESAREAN SECTION (N/A) - 6 15 16  Tracie.S. to RNFA confirmed on 05/24/2015 with Donne Hazel RN   Patient is awake, responsive, moving her legs, and has signs of resolution of her numbness. Pain and nausea are reasonably well controlled. Vital signs are stable and clinically acceptable. Oxygen saturation is clinically acceptable. There are no apparent anesthetic complications at this time. Patient is ready for discharge.

## 2015-05-27 NOTE — Progress Notes (Signed)
Patient states she is feeling better now.

## 2015-05-27 NOTE — Transfer of Care (Signed)
Immediate Anesthesia Transfer of Care Note  Patient: Gina Weaver  Procedure(s) Performed: Procedure(s) with comments: REPEAT CESAREAN SECTION (N/A) - 6 15 16  Tracie.S. to RNFA confirmed on 05/24/2015 with Donne Hazel RN   Patient Location: PACU  Anesthesia Type:Spinal  Level of Consciousness: awake, alert  and oriented  Airway & Oxygen Therapy: Patient Spontanous Breathing  Post-op Assessment: Report given to RN and Post -op Vital signs reviewed and stable  Post vital signs: Reviewed and stable  Last Vitals:  Filed Vitals:   05/27/15 0945  BP: 103/63  Pulse: 75  Temp:   Resp: 15    Complications: No apparent anesthesia complications

## 2015-05-27 NOTE — Consult Note (Signed)
Neonatology Note:   Attendance at C-section:    I was asked by Dr. Renaldo Fiddler to attend this repeat C/S at term. The mother is a G3P1A1 O pos, GBS neg with frank breech presentation. ROM at delivery, fluid clear. Infant vigorous with good spontaneous cry and tone. Needed no suctioning. Ap 9/9. Lungs clear to ausc in DR. To CN to care of Pediatrician.   Doretha Sou, MD

## 2015-05-27 NOTE — Progress Notes (Signed)
I was called into the room. Patient stated she was dizzy. Bed was lowered , fundus firm scant lochia, sats 96 on RA, BP was 102/62 pulse 71. Dr. Renaldo Fiddler called and updated on patient's BP of 102/62 , pulse 71, and good urine output and lochia scant. Dr. Renaldo Fiddler aware that patient had oozing from left side of honeycomb. Pressure dressing applied per MD order verbally. Will monitor, no further orders at this time.

## 2015-05-27 NOTE — Op Note (Signed)
Cesarean Section Procedure Note   Gina Weaver  05/27/2015  Indications: Breech Presentation and Scheduled Proceedure/Maternal Request   Pre-operative Diagnosis: PREVIOUS CESAREAN SECTION.   Post-operative Diagnosis: Same   Surgeon: Surgeon(s) and Role:    * Zelphia Cairo, MD - Primary   Assistants: tracy sumner  Anesthesia: spinal   Procedure Details:  The patient was seen in the Holding Room. The risks, benefits, complications, treatment options, and expected outcomes were discussed with the patient. The patient concurred with the proposed plan, giving informed consent. identified as Gina Weaver and the procedure verified as C-Section Delivery. A Time Out was held and the above information confirmed.  After induction of anesthesia, the patient was draped and prepped in the usual sterile manner. A transverse was made and carried down through the subcutaneous tissue to the fascia. Fascial incision was made and extended transversely. The fascia was separated from the underlying rectus tissue superiorly and inferiorly. The peritoneum was identified and entered. Peritoneal incision was extended longitudinally. The utero-vesical peritoneal reflection was incised transversely and the bladder flap was bluntly freed from the lower uterine segment. A low transverse uterine incision was made. Delivered from breech presentation was a vigorous female with  Cord ph was not sent the umbilical cord was clamped and cut cord blood was obtained for evaluation. The placenta was removed Intact and appeared normal. The uterine outline, tubes and ovaries appeared normal}. The uterine incision was closed with running locked sutures of 0chromic gut.   Hemostasis was observed. Lavage was carried out until clear. The fascia was then reapproximated with running sutures of 0PDS. The skin was closed with 4-0Vicryl.   Instrument, sponge, and needle counts were correct prior the abdominal closure and were correct  at the conclusion of the case.     Estimated Blood Loss:600cc   Urine Output: clear  Specimens: none  Complications: no complications  Disposition: PACU - hemodynamically stable.   Maternal Condition: stable   Baby condition / location:  Couplet care / Skin to Skin  Attending Attestation: I was present and scrubbed for the entire procedure.   Signed: Surgeon(s): Zelphia Cairo, MD

## 2015-05-28 LAB — CBC
HEMATOCRIT: 31.1 % — AB (ref 36.0–46.0)
Hemoglobin: 10.5 g/dL — ABNORMAL LOW (ref 12.0–15.0)
MCH: 30.3 pg (ref 26.0–34.0)
MCHC: 33.8 g/dL (ref 30.0–36.0)
MCV: 89.9 fL (ref 78.0–100.0)
PLATELETS: 148 10*3/uL — AB (ref 150–400)
RBC: 3.46 MIL/uL — ABNORMAL LOW (ref 3.87–5.11)
RDW: 14.2 % (ref 11.5–15.5)
WBC: 12.2 10*3/uL — AB (ref 4.0–10.5)

## 2015-05-28 LAB — BIRTH TISSUE RECOVERY COLLECTION (PLACENTA DONATION)

## 2015-05-28 MED ORDER — HYDROMORPHONE HCL 2 MG PO TABS: 2.0000 mg | ORAL_TABLET | ORAL | Status: DC | PRN

## 2015-05-28 NOTE — Progress Notes (Signed)
Subjective: Postpartum Day 1: Cesarean Delivery Patient reports tolerating PO, + flatus and no problems voiding.    Objective: Vital signs in last 24 hours: Temp:  [97.6 F (36.4 C)-98.1 F (36.7 C)] 97.9 F (36.6 C) (06/10 0600) Pulse Rate:  [63-85] 66 (06/10 0600) Resp:  [15-20] 18 (06/10 0600) BP: (87-117)/(46-85) 95/57 mmHg (06/10 0600) SpO2:  [97 %-100 %] 97 % (06/10 0600)  Physical Exam:  General: alert and cooperative Lochia: appropriate Uterine Fundus: firm Incision: healing well, abd pressure dressing removed, honeycomb dressing replaced. No active bleeding DVT Evaluation: No evidence of DVT seen on physical exam. Negative Homan's sign. No cords or calf tenderness. No significant calf/ankle edema.   Recent Labs  05/25/15 1115 05/28/15 0535  HGB 11.9* 10.5*  HCT 35.6* 31.1*    Assessment/Plan: Status post Cesarean section. Doing well postoperatively.  Continue current care.  CURTIS,CAROL G 05/28/2015, 7:53 AM

## 2015-05-28 NOTE — Progress Notes (Signed)
Pt requesting percocet for severe pain. Pt has a sensitivity to tylenol/ stomach upset with continued use. Called MD for an order for Dilaudid 2mg  every 4 hours prn pain. Pt informed me after the fact that she couldn't take dilaudid and wanted to take the percocet instead. One percocet given to pt for inc pain =9. Warm pack also applied to inc.

## 2015-05-29 ENCOUNTER — Encounter (HOSPITAL_COMMUNITY): Payer: Self-pay | Admitting: Obstetrics and Gynecology

## 2015-05-29 MED ORDER — OXYCODONE-IBUPROFEN 5-400 MG PO TABS: 1.0000 | ORAL_TABLET | Freq: Four times a day (QID) | ORAL | Status: AC | PRN

## 2015-05-29 NOTE — Discharge Summary (Signed)
Obstetric Discharge Summary Reason for Admission: cesarean section Prenatal Procedures: none Intrapartum Procedures: cesarean: low cervical, transverse Postpartum Procedures: none Complications-Operative and Postpartum: none HEMOGLOBIN  Date Value Ref Range Status  05/28/2015 10.5* 12.0 - 15.0 g/dL Final   HCT  Date Value Ref Range Status  05/28/2015 31.1* 36.0 - 46.0 % Final    Physical Exam:  General: alert Lochia: appropriate Uterine Fundus: firm Incision: healing well DVT Evaluation: No evidence of DVT seen on physical exam.  Discharge Diagnoses: Term Pregnancy-delivered  Discharge Information: Date: 05/29/2015 Activity: pelvic rest Diet: routine Medications: Percocet Condition: stable Instructions: refer to practice specific booklet Discharge to: home   Newborn Data: Live born female  Birth Weight: 6 lb 15.8 oz (3170 g) APGAR: 9, 9  Home with mother.  Nyelle Wolfson S 05/29/2015, 8:35 AM

## 2015-05-29 NOTE — Lactation Note (Signed)
This note was copied from the chart of Gina Tanitra Koehler. Lactation Consultation Note Experienced BF mom stated she didn't need to see LC before d/c home. Patient Name: Gina Weaver [id number] Date: 05/29/2015     Maternal Data    Feeding    LATCH Score/Interventions                      Lactation Tools Discussed/Used     Consult Status      Charyl Dancer 05/29/2015, 9:57 AM

## 2015-07-06 ENCOUNTER — Other Ambulatory Visit: Payer: Self-pay | Admitting: Obstetrics and Gynecology

## 2015-07-07 LAB — CYTOLOGY - PAP

## 2016-02-18 ENCOUNTER — Telehealth: Payer: Self-pay | Admitting: Adult Health

## 2016-02-18 ENCOUNTER — Other Ambulatory Visit: Payer: Self-pay | Admitting: Obstetrics and Gynecology

## 2016-02-18 DIAGNOSIS — N6311 Unspecified lump in the right breast, upper outer quadrant: Secondary | ICD-10-CM

## 2016-02-18 NOTE — Telephone Encounter (Signed)
Spoke with pt. Gave results from right breast US from 2014. Pt voiced understanding and had no further questions. JSY

## 2016-02-22 ENCOUNTER — Other Ambulatory Visit: Payer: Self-pay | Admitting: Obstetrics and Gynecology

## 2016-02-22 ENCOUNTER — Ambulatory Visit
Admission: RE | Admit: 2016-02-22 | Discharge: 2016-02-22 | Disposition: A | Discharge status: Home / Self Care | Payer: BLUE CROSS/BLUE SHIELD | Source: Ambulatory Visit | Attending: Obstetrics and Gynecology | Admitting: Obstetrics and Gynecology

## 2016-02-22 DIAGNOSIS — N6311 Unspecified lump in the right breast, upper outer quadrant: Secondary | ICD-10-CM

## 2016-04-17 DIAGNOSIS — R42 Dizziness and giddiness: Secondary | ICD-10-CM | POA: Diagnosis not present

## 2016-04-17 DIAGNOSIS — F410 Panic disorder [episodic paroxysmal anxiety] without agoraphobia: Secondary | ICD-10-CM | POA: Diagnosis not present

## 2016-04-17 DIAGNOSIS — R55 Syncope and collapse: Secondary | ICD-10-CM | POA: Diagnosis not present

## 2016-04-25 DIAGNOSIS — L219 Seborrheic dermatitis, unspecified: Secondary | ICD-10-CM | POA: Diagnosis not present

## 2016-05-01 DIAGNOSIS — J310 Chronic rhinitis: Secondary | ICD-10-CM | POA: Diagnosis not present

## 2016-05-01 DIAGNOSIS — J4530 Mild persistent asthma, uncomplicated: Secondary | ICD-10-CM | POA: Diagnosis not present

## 2016-05-01 DIAGNOSIS — J019 Acute sinusitis, unspecified: Secondary | ICD-10-CM | POA: Diagnosis not present

## 2016-05-10 DIAGNOSIS — J309 Allergic rhinitis, unspecified: Secondary | ICD-10-CM | POA: Diagnosis not present

## 2016-05-10 DIAGNOSIS — J324 Chronic pansinusitis: Secondary | ICD-10-CM | POA: Diagnosis not present

## 2016-05-30 DIAGNOSIS — J329 Chronic sinusitis, unspecified: Secondary | ICD-10-CM | POA: Diagnosis not present

## 2016-05-30 DIAGNOSIS — J019 Acute sinusitis, unspecified: Secondary | ICD-10-CM | POA: Diagnosis not present

## 2016-07-10 DIAGNOSIS — N390 Urinary tract infection, site not specified: Secondary | ICD-10-CM | POA: Diagnosis not present

## 2016-07-10 DIAGNOSIS — Z681 Body mass index (BMI) 19 or less, adult: Secondary | ICD-10-CM | POA: Diagnosis not present

## 2016-07-10 DIAGNOSIS — Z01419 Encounter for gynecological examination (general) (routine) without abnormal findings: Secondary | ICD-10-CM | POA: Diagnosis not present

## 2016-07-27 DIAGNOSIS — R630 Anorexia: Secondary | ICD-10-CM | POA: Diagnosis not present

## 2016-07-27 DIAGNOSIS — J45909 Unspecified asthma, uncomplicated: Secondary | ICD-10-CM | POA: Diagnosis not present

## 2016-07-27 DIAGNOSIS — Z79899 Other long term (current) drug therapy: Secondary | ICD-10-CM | POA: Diagnosis not present

## 2016-08-03 DIAGNOSIS — R630 Anorexia: Secondary | ICD-10-CM | POA: Diagnosis not present

## 2016-08-03 DIAGNOSIS — Z681 Body mass index (BMI) 19 or less, adult: Secondary | ICD-10-CM | POA: Diagnosis not present

## 2016-08-03 DIAGNOSIS — N200 Calculus of kidney: Secondary | ICD-10-CM | POA: Diagnosis not present

## 2016-08-03 DIAGNOSIS — Z0001 Encounter for general adult medical examination with abnormal findings: Secondary | ICD-10-CM | POA: Diagnosis not present

## 2016-10-10 DIAGNOSIS — J324 Chronic pansinusitis: Secondary | ICD-10-CM | POA: Diagnosis not present

## 2016-10-27 DIAGNOSIS — R05 Cough: Secondary | ICD-10-CM | POA: Diagnosis not present

## 2016-12-26 ENCOUNTER — Other Ambulatory Visit: Payer: Self-pay

## 2016-12-26 DIAGNOSIS — J323 Chronic sphenoidal sinusitis: Secondary | ICD-10-CM | POA: Diagnosis not present

## 2016-12-26 DIAGNOSIS — J330 Polyp of nasal cavity: Secondary | ICD-10-CM | POA: Diagnosis not present

## 2016-12-26 DIAGNOSIS — J324 Chronic pansinusitis: Secondary | ICD-10-CM | POA: Diagnosis not present

## 2016-12-26 DIAGNOSIS — J321 Chronic frontal sinusitis: Secondary | ICD-10-CM | POA: Diagnosis not present

## 2016-12-26 DIAGNOSIS — J322 Chronic ethmoidal sinusitis: Secondary | ICD-10-CM | POA: Diagnosis not present

## 2016-12-26 DIAGNOSIS — J329 Chronic sinusitis, unspecified: Secondary | ICD-10-CM | POA: Diagnosis not present

## 2016-12-26 DIAGNOSIS — J338 Other polyp of sinus: Secondary | ICD-10-CM | POA: Diagnosis not present

## 2017-01-02 DIAGNOSIS — J324 Chronic pansinusitis: Secondary | ICD-10-CM | POA: Diagnosis not present

## 2017-01-05 DIAGNOSIS — J324 Chronic pansinusitis: Secondary | ICD-10-CM | POA: Diagnosis not present

## 2017-01-05 DIAGNOSIS — J3489 Other specified disorders of nose and nasal sinuses: Secondary | ICD-10-CM | POA: Diagnosis not present

## 2017-01-12 DIAGNOSIS — J324 Chronic pansinusitis: Secondary | ICD-10-CM | POA: Diagnosis not present

## 2017-01-29 DIAGNOSIS — J324 Chronic pansinusitis: Secondary | ICD-10-CM | POA: Diagnosis not present

## 2017-02-14 DIAGNOSIS — J324 Chronic pansinusitis: Secondary | ICD-10-CM | POA: Diagnosis not present

## 2017-05-01 DIAGNOSIS — F419 Anxiety disorder, unspecified: Secondary | ICD-10-CM | POA: Diagnosis not present

## 2017-05-01 DIAGNOSIS — Z681 Body mass index (BMI) 19 or less, adult: Secondary | ICD-10-CM | POA: Diagnosis not present

## 2017-05-01 DIAGNOSIS — R0602 Shortness of breath: Secondary | ICD-10-CM | POA: Diagnosis not present

## 2017-05-22 DIAGNOSIS — R0602 Shortness of breath: Secondary | ICD-10-CM | POA: Diagnosis not present

## 2017-05-22 DIAGNOSIS — F419 Anxiety disorder, unspecified: Secondary | ICD-10-CM | POA: Diagnosis not present

## 2017-07-11 DIAGNOSIS — Z681 Body mass index (BMI) 19 or less, adult: Secondary | ICD-10-CM | POA: Diagnosis not present

## 2017-07-11 DIAGNOSIS — Z01419 Encounter for gynecological examination (general) (routine) without abnormal findings: Secondary | ICD-10-CM | POA: Diagnosis not present

## 2017-08-03 DIAGNOSIS — Z0001 Encounter for general adult medical examination with abnormal findings: Secondary | ICD-10-CM | POA: Diagnosis not present

## 2017-08-10 DIAGNOSIS — Z0001 Encounter for general adult medical examination with abnormal findings: Secondary | ICD-10-CM | POA: Diagnosis not present

## 2017-08-10 DIAGNOSIS — Z681 Body mass index (BMI) 19 or less, adult: Secondary | ICD-10-CM | POA: Diagnosis not present

## 2017-08-10 DIAGNOSIS — N200 Calculus of kidney: Secondary | ICD-10-CM | POA: Diagnosis not present

## 2017-08-10 DIAGNOSIS — R630 Anorexia: Secondary | ICD-10-CM | POA: Diagnosis not present

## 2018-02-08 DIAGNOSIS — R630 Anorexia: Secondary | ICD-10-CM | POA: Diagnosis not present

## 2018-02-08 DIAGNOSIS — Z6820 Body mass index (BMI) 20.0-20.9, adult: Secondary | ICD-10-CM | POA: Diagnosis not present

## 2018-02-08 DIAGNOSIS — F419 Anxiety disorder, unspecified: Secondary | ICD-10-CM | POA: Diagnosis not present

## 2018-09-24 DIAGNOSIS — Z0000 Encounter for general adult medical examination without abnormal findings: Secondary | ICD-10-CM | POA: Diagnosis not present

## 2018-09-24 DIAGNOSIS — Z01419 Encounter for gynecological examination (general) (routine) without abnormal findings: Secondary | ICD-10-CM | POA: Diagnosis not present

## 2018-09-24 DIAGNOSIS — Z131 Encounter for screening for diabetes mellitus: Secondary | ICD-10-CM | POA: Diagnosis not present

## 2018-09-24 DIAGNOSIS — Z1329 Encounter for screening for other suspected endocrine disorder: Secondary | ICD-10-CM | POA: Diagnosis not present

## 2018-09-24 DIAGNOSIS — Z6820 Body mass index (BMI) 20.0-20.9, adult: Secondary | ICD-10-CM | POA: Diagnosis not present

## 2019-05-08 DIAGNOSIS — Z79899 Other long term (current) drug therapy: Secondary | ICD-10-CM | POA: Diagnosis not present

## 2019-05-08 DIAGNOSIS — F419 Anxiety disorder, unspecified: Secondary | ICD-10-CM | POA: Diagnosis not present

## 2019-05-08 DIAGNOSIS — F5000 Anorexia nervosa, unspecified: Secondary | ICD-10-CM | POA: Diagnosis not present

## 2019-05-15 DIAGNOSIS — J45909 Unspecified asthma, uncomplicated: Secondary | ICD-10-CM | POA: Diagnosis not present

## 2019-05-15 DIAGNOSIS — R630 Anorexia: Secondary | ICD-10-CM | POA: Diagnosis not present

## 2019-05-15 DIAGNOSIS — F419 Anxiety disorder, unspecified: Secondary | ICD-10-CM | POA: Diagnosis not present

## 2019-10-22 DIAGNOSIS — Z01419 Encounter for gynecological examination (general) (routine) without abnormal findings: Secondary | ICD-10-CM | POA: Diagnosis not present

## 2019-10-22 DIAGNOSIS — Z3040 Encounter for surveillance of contraceptives, unspecified: Secondary | ICD-10-CM | POA: Diagnosis not present

## 2019-10-22 DIAGNOSIS — Z6821 Body mass index (BMI) 21.0-21.9, adult: Secondary | ICD-10-CM | POA: Diagnosis not present

## 2020-05-06 DIAGNOSIS — R630 Anorexia: Secondary | ICD-10-CM | POA: Diagnosis not present

## 2020-05-06 DIAGNOSIS — Z79899 Other long term (current) drug therapy: Secondary | ICD-10-CM | POA: Diagnosis not present

## 2020-05-06 DIAGNOSIS — J45909 Unspecified asthma, uncomplicated: Secondary | ICD-10-CM | POA: Diagnosis not present

## 2020-05-06 DIAGNOSIS — F419 Anxiety disorder, unspecified: Secondary | ICD-10-CM | POA: Diagnosis not present

## 2020-05-13 DIAGNOSIS — Z0001 Encounter for general adult medical examination with abnormal findings: Secondary | ICD-10-CM | POA: Diagnosis not present

## 2020-05-13 DIAGNOSIS — R630 Anorexia: Secondary | ICD-10-CM | POA: Diagnosis not present

## 2020-05-13 DIAGNOSIS — F419 Anxiety disorder, unspecified: Secondary | ICD-10-CM | POA: Diagnosis not present

## 2020-05-13 DIAGNOSIS — J45909 Unspecified asthma, uncomplicated: Secondary | ICD-10-CM | POA: Diagnosis not present

## 2020-08-11 DIAGNOSIS — R05 Cough: Secondary | ICD-10-CM | POA: Diagnosis not present

## 2020-08-17 DIAGNOSIS — J019 Acute sinusitis, unspecified: Secondary | ICD-10-CM | POA: Diagnosis not present

## 2020-11-23 DIAGNOSIS — Z6822 Body mass index (BMI) 22.0-22.9, adult: Secondary | ICD-10-CM | POA: Diagnosis not present

## 2020-11-23 DIAGNOSIS — Z01419 Encounter for gynecological examination (general) (routine) without abnormal findings: Secondary | ICD-10-CM | POA: Diagnosis not present

## 2022-06-02 DIAGNOSIS — J309 Allergic rhinitis, unspecified: Secondary | ICD-10-CM | POA: Diagnosis not present

## 2022-08-23 DIAGNOSIS — Z79899 Other long term (current) drug therapy: Secondary | ICD-10-CM | POA: Diagnosis not present

## 2022-08-23 DIAGNOSIS — Z8659 Personal history of other mental and behavioral disorders: Secondary | ICD-10-CM | POA: Diagnosis not present

## 2022-08-23 DIAGNOSIS — R69 Illness, unspecified: Secondary | ICD-10-CM | POA: Diagnosis not present

## 2022-08-23 DIAGNOSIS — T7840XA Allergy, unspecified, initial encounter: Secondary | ICD-10-CM | POA: Diagnosis not present

## 2022-08-29 DIAGNOSIS — J309 Allergic rhinitis, unspecified: Secondary | ICD-10-CM | POA: Diagnosis not present

## 2022-08-29 DIAGNOSIS — R69 Illness, unspecified: Secondary | ICD-10-CM | POA: Diagnosis not present

## 2022-08-29 DIAGNOSIS — Z0001 Encounter for general adult medical examination with abnormal findings: Secondary | ICD-10-CM | POA: Diagnosis not present

## 2022-08-29 DIAGNOSIS — J4520 Mild intermittent asthma, uncomplicated: Secondary | ICD-10-CM | POA: Diagnosis not present

## 2022-12-20 DIAGNOSIS — H33323 Round hole, bilateral: Secondary | ICD-10-CM | POA: Diagnosis not present

## 2022-12-20 DIAGNOSIS — H53142 Visual discomfort, left eye: Secondary | ICD-10-CM | POA: Diagnosis not present

## 2022-12-20 DIAGNOSIS — H43391 Other vitreous opacities, right eye: Secondary | ICD-10-CM | POA: Diagnosis not present

## 2023-02-05 DIAGNOSIS — H33321 Round hole, right eye: Secondary | ICD-10-CM | POA: Diagnosis not present

## 2023-02-16 DIAGNOSIS — H5319 Other subjective visual disturbances: Secondary | ICD-10-CM | POA: Diagnosis not present

## 2023-02-16 DIAGNOSIS — H43391 Other vitreous opacities, right eye: Secondary | ICD-10-CM | POA: Diagnosis not present

## 2023-02-16 DIAGNOSIS — H35412 Lattice degeneration of retina, left eye: Secondary | ICD-10-CM | POA: Diagnosis not present

## 2023-02-22 DIAGNOSIS — H33322 Round hole, left eye: Secondary | ICD-10-CM | POA: Diagnosis not present

## 2023-03-01 DIAGNOSIS — Z3040 Encounter for surveillance of contraceptives, unspecified: Secondary | ICD-10-CM | POA: Diagnosis not present

## 2023-03-01 DIAGNOSIS — Z01419 Encounter for gynecological examination (general) (routine) without abnormal findings: Secondary | ICD-10-CM | POA: Diagnosis not present

## 2023-05-15 DIAGNOSIS — H31092 Other chorioretinal scars, left eye: Secondary | ICD-10-CM | POA: Diagnosis not present

## 2023-05-15 DIAGNOSIS — H33312 Horseshoe tear of retina without detachment, left eye: Secondary | ICD-10-CM | POA: Diagnosis not present

## 2023-05-15 DIAGNOSIS — H33322 Round hole, left eye: Secondary | ICD-10-CM | POA: Diagnosis not present

## 2023-05-31 DIAGNOSIS — H33312 Horseshoe tear of retina without detachment, left eye: Secondary | ICD-10-CM | POA: Diagnosis not present

## 2023-08-30 DIAGNOSIS — Z0001 Encounter for general adult medical examination with abnormal findings: Secondary | ICD-10-CM | POA: Diagnosis not present

## 2023-09-06 DIAGNOSIS — Z0001 Encounter for general adult medical examination with abnormal findings: Secondary | ICD-10-CM | POA: Diagnosis not present

## 2023-09-06 DIAGNOSIS — J4520 Mild intermittent asthma, uncomplicated: Secondary | ICD-10-CM | POA: Diagnosis not present

## 2023-09-06 DIAGNOSIS — F411 Generalized anxiety disorder: Secondary | ICD-10-CM | POA: Diagnosis not present

## 2023-11-29 DIAGNOSIS — H33321 Round hole, right eye: Secondary | ICD-10-CM | POA: Diagnosis not present

## 2023-11-29 DIAGNOSIS — H33311 Horseshoe tear of retina without detachment, right eye: Secondary | ICD-10-CM | POA: Diagnosis not present

## 2024-01-10 DIAGNOSIS — H33321 Round hole, right eye: Secondary | ICD-10-CM | POA: Diagnosis not present

## 2024-03-03 DIAGNOSIS — Z3040 Encounter for surveillance of contraceptives, unspecified: Secondary | ICD-10-CM | POA: Diagnosis not present

## 2024-03-03 DIAGNOSIS — Z01419 Encounter for gynecological examination (general) (routine) without abnormal findings: Secondary | ICD-10-CM | POA: Diagnosis not present

## 2024-03-03 DIAGNOSIS — Z1151 Encounter for screening for human papillomavirus (HPV): Secondary | ICD-10-CM | POA: Diagnosis not present

## 2024-03-03 DIAGNOSIS — R319 Hematuria, unspecified: Secondary | ICD-10-CM | POA: Diagnosis not present

## 2024-03-03 DIAGNOSIS — Z124 Encounter for screening for malignant neoplasm of cervix: Secondary | ICD-10-CM | POA: Diagnosis not present

## 2024-03-03 DIAGNOSIS — Z681 Body mass index (BMI) 19 or less, adult: Secondary | ICD-10-CM | POA: Diagnosis not present

## 2024-04-08 DIAGNOSIS — R319 Hematuria, unspecified: Secondary | ICD-10-CM | POA: Diagnosis not present

## 2024-04-08 DIAGNOSIS — Z1389 Encounter for screening for other disorder: Secondary | ICD-10-CM | POA: Diagnosis not present

## 2024-04-08 DIAGNOSIS — N926 Irregular menstruation, unspecified: Secondary | ICD-10-CM | POA: Diagnosis not present

## 2024-05-15 DIAGNOSIS — H59813 Chorioretinal scars after surgery for detachment, bilateral: Secondary | ICD-10-CM | POA: Diagnosis not present

## 2024-05-15 DIAGNOSIS — H33101 Unspecified retinoschisis, right eye: Secondary | ICD-10-CM | POA: Diagnosis not present

## 2024-07-30 DIAGNOSIS — B372 Candidiasis of skin and nail: Secondary | ICD-10-CM | POA: Diagnosis not present

## 2024-09-09 DIAGNOSIS — Z1231 Encounter for screening mammogram for malignant neoplasm of breast: Secondary | ICD-10-CM | POA: Diagnosis not present

## 2024-09-11 DIAGNOSIS — Z0001 Encounter for general adult medical examination with abnormal findings: Secondary | ICD-10-CM | POA: Diagnosis not present

## 2024-09-18 DIAGNOSIS — F411 Generalized anxiety disorder: Secondary | ICD-10-CM | POA: Diagnosis not present

## 2024-09-18 DIAGNOSIS — J45909 Unspecified asthma, uncomplicated: Secondary | ICD-10-CM | POA: Diagnosis not present

## 2024-09-18 DIAGNOSIS — N200 Calculus of kidney: Secondary | ICD-10-CM | POA: Diagnosis not present

## 2024-09-18 DIAGNOSIS — Z8659 Personal history of other mental and behavioral disorders: Secondary | ICD-10-CM | POA: Diagnosis not present

## 2024-09-18 DIAGNOSIS — Z0001 Encounter for general adult medical examination with abnormal findings: Secondary | ICD-10-CM | POA: Diagnosis not present

## 2024-09-18 DIAGNOSIS — J302 Other seasonal allergic rhinitis: Secondary | ICD-10-CM | POA: Diagnosis not present

## 2024-11-06 DIAGNOSIS — Z9889 Other specified postprocedural states: Secondary | ICD-10-CM | POA: Diagnosis not present

## 2024-11-06 DIAGNOSIS — J3089 Other allergic rhinitis: Secondary | ICD-10-CM | POA: Diagnosis not present

## 2024-11-06 DIAGNOSIS — Z7951 Long term (current) use of inhaled steroids: Secondary | ICD-10-CM | POA: Diagnosis not present

## 2024-11-06 DIAGNOSIS — Z79899 Other long term (current) drug therapy: Secondary | ICD-10-CM | POA: Diagnosis not present
